# Patient Record
Sex: Male | Born: 1955
Health system: Southern US, Community
[De-identification: ages and names within clinical notes are randomized; demographics above are authoritative.]

## PROBLEM LIST (undated history)

## (undated) DIAGNOSIS — M1990 Unspecified osteoarthritis, unspecified site: Secondary | ICD-10-CM

## (undated) DIAGNOSIS — I00 Rheumatic fever without heart involvement: Secondary | ICD-10-CM

## (undated) DIAGNOSIS — Z8679 Personal history of other diseases of the circulatory system: Secondary | ICD-10-CM

## (undated) DIAGNOSIS — R002 Palpitations: Secondary | ICD-10-CM

## (undated) DIAGNOSIS — N419 Inflammatory disease of prostate, unspecified: Secondary | ICD-10-CM

## (undated) DIAGNOSIS — R0789 Other chest pain: Secondary | ICD-10-CM

## (undated) DIAGNOSIS — I10 Essential (primary) hypertension: Secondary | ICD-10-CM

## (undated) DIAGNOSIS — E785 Hyperlipidemia, unspecified: Secondary | ICD-10-CM

## (undated) DIAGNOSIS — N400 Enlarged prostate without lower urinary tract symptoms: Secondary | ICD-10-CM

## (undated) DIAGNOSIS — F419 Anxiety disorder, unspecified: Secondary | ICD-10-CM

## (undated) DIAGNOSIS — N189 Chronic kidney disease, unspecified: Secondary | ICD-10-CM

## (undated) DIAGNOSIS — R011 Cardiac murmur, unspecified: Secondary | ICD-10-CM

## (undated) HISTORY — DX: Unspecified osteoarthritis, unspecified site: M19.90

## (undated) HISTORY — DX: Personal history of other diseases of the circulatory system: Z86.79

## (undated) HISTORY — DX: Chronic kidney disease, unspecified: N18.9

## (undated) HISTORY — DX: Anxiety disorder, unspecified: F41.9

## (undated) HISTORY — PX: POLYPECTOMY: SHX149

## (undated) HISTORY — DX: Cardiac murmur, unspecified: R01.1

## (undated) HISTORY — DX: Other chest pain: R07.89

## (undated) HISTORY — PX: COLONOSCOPY: SHX174

## (undated) HISTORY — PX: LITHOTRIPSY: SUR834

## (undated) HISTORY — DX: Hyperlipidemia, unspecified: E78.5

## (undated) HISTORY — DX: Essential (primary) hypertension: I10

## (undated) HISTORY — DX: Palpitations: R00.2

## (undated) HISTORY — DX: Inflammatory disease of prostate, unspecified: N41.9

## (undated) HISTORY — DX: Benign prostatic hyperplasia without lower urinary tract symptoms: N40.0

## (undated) HISTORY — DX: Rheumatic fever without heart involvement: I00

---

## 2008-10-07 ENCOUNTER — Ambulatory Visit: Payer: Self-pay | Admitting: Gastroenterology

## 2008-10-14 ENCOUNTER — Ambulatory Visit: Payer: Self-pay | Admitting: Gastroenterology

## 2008-10-14 ENCOUNTER — Encounter: Payer: Self-pay | Admitting: Gastroenterology

## 2008-10-16 ENCOUNTER — Encounter: Payer: Self-pay | Admitting: Gastroenterology

## 2011-10-19 ENCOUNTER — Encounter: Payer: Self-pay | Admitting: Gastroenterology

## 2012-05-31 ENCOUNTER — Encounter: Payer: Self-pay | Admitting: Gastroenterology

## 2012-06-08 ENCOUNTER — Ambulatory Visit: Payer: Self-pay | Admitting: Urology

## 2012-08-14 ENCOUNTER — Ambulatory Visit: Payer: Self-pay | Admitting: Urology

## 2012-08-14 DIAGNOSIS — N23 Unspecified renal colic: Secondary | ICD-10-CM | POA: Insufficient documentation

## 2012-08-17 ENCOUNTER — Ambulatory Visit: Payer: Self-pay | Admitting: Urology

## 2012-09-01 ENCOUNTER — Ambulatory Visit: Payer: Self-pay | Admitting: Urology

## 2012-12-25 ENCOUNTER — Encounter (INDEPENDENT_AMBULATORY_CARE_PROVIDER_SITE_OTHER): Payer: BC Managed Care – PPO

## 2012-12-25 DIAGNOSIS — R079 Chest pain, unspecified: Secondary | ICD-10-CM

## 2012-12-28 ENCOUNTER — Encounter: Payer: Self-pay | Admitting: Internal Medicine

## 2013-02-26 ENCOUNTER — Ambulatory Visit: Payer: Self-pay | Admitting: Urology

## 2013-02-26 DIAGNOSIS — N200 Calculus of kidney: Secondary | ICD-10-CM | POA: Insufficient documentation

## 2013-02-26 DIAGNOSIS — Z87442 Personal history of urinary calculi: Secondary | ICD-10-CM | POA: Insufficient documentation

## 2013-08-07 ENCOUNTER — Ambulatory Visit: Payer: BC Managed Care – PPO | Admitting: Internal Medicine

## 2013-08-31 ENCOUNTER — Ambulatory Visit (INDEPENDENT_AMBULATORY_CARE_PROVIDER_SITE_OTHER): Payer: BC Managed Care – PPO | Admitting: Internal Medicine

## 2013-08-31 VITALS — BP 130/70 | HR 57 | Ht 68.0 in | Wt 205.0 lb

## 2013-08-31 DIAGNOSIS — F411 Generalized anxiety disorder: Secondary | ICD-10-CM

## 2013-08-31 DIAGNOSIS — R002 Palpitations: Secondary | ICD-10-CM

## 2013-08-31 DIAGNOSIS — Z8679 Personal history of other diseases of the circulatory system: Secondary | ICD-10-CM

## 2013-08-31 DIAGNOSIS — R0789 Other chest pain: Secondary | ICD-10-CM

## 2013-08-31 DIAGNOSIS — F419 Anxiety disorder, unspecified: Secondary | ICD-10-CM

## 2013-08-31 DIAGNOSIS — I4892 Unspecified atrial flutter: Secondary | ICD-10-CM

## 2013-08-31 DIAGNOSIS — I10 Essential (primary) hypertension: Secondary | ICD-10-CM

## 2013-09-03 ENCOUNTER — Encounter: Payer: Self-pay | Admitting: Internal Medicine

## 2013-09-03 DIAGNOSIS — R002 Palpitations: Secondary | ICD-10-CM | POA: Insufficient documentation

## 2013-09-03 DIAGNOSIS — F419 Anxiety disorder, unspecified: Secondary | ICD-10-CM | POA: Insufficient documentation

## 2013-09-03 DIAGNOSIS — R0789 Other chest pain: Secondary | ICD-10-CM | POA: Insufficient documentation

## 2013-09-03 DIAGNOSIS — I10 Essential (primary) hypertension: Secondary | ICD-10-CM | POA: Insufficient documentation

## 2013-09-03 DIAGNOSIS — Z8679 Personal history of other diseases of the circulatory system: Secondary | ICD-10-CM | POA: Insufficient documentation

## 2013-09-03 NOTE — Progress Notes (Signed)
OFFICE NOTE  Chief Complaint:  Palpitations  Primary Care Physician: Haywood Pao, MD  HPI:  Richard Webster is a 58 year old gentleman you recently saw in the office for a physical. It had been about 4 years since his last physical, and he had an EKG which was slightly abnormal showing an incomplete right bundle branch block. He then, in conversation, apparently described symptoms of left-sided chest tingling and a sharp chest discomfort which occurred after walking associated with some mild heaviness in his chest. He also is quite active. 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 During these activities he denies any of these symptoms. He has had occasional numbness down his left arm from time to time. He recently had a chiropractor visit. The chiropractor did a neck realignment and he feels that his symptoms got a little bit better. He also tells me that he has a history of rheumatic fever as a child and had a murmur in the past but it has since then gotten better.  His last office visit in 12/2012 , he underwent metabolic testing.  Peak VO2 was 90% and our RER was 1.08.  The heart rate and VO2 curves were essentially normal.  This is very reassuring. Since that time however he has described more frequent fluttering in the chest and is here for evaluation of that today to  PMHx:  Past Medical History  Diagnosis Date  . Palpitations   . Atypical chest pain   . Anxiety   . Heart murmur   . Rheumatic fever     History reviewed. No pertinent past surgical history.  FAMHx:  Family History  Problem Relation Age of Onset  . Stroke Mother   . Dementia Mother     SOCHx:   reports that he quit smoking about 30 years ago. His smoking use included Cigarettes. He smoked 0.00 packs per day. He does not have any smokeless tobacco history on file. His alcohol and drug histories are not on file.  ALLERGIES:  Not on File  ROS: A comprehensive review of systems  was negative except for: Cardiovascular: positive for palpitations  HOME MEDS: Current Outpatient Prescriptions  Medication Sig Dispense Refill  . aspirin 81 MG tablet Take 81 mg by mouth daily.      Marland Kitchen losartan (COZAAR) 50 MG tablet        No current facility-administered medications for this visit.    LABS/IMAGING: No results found for this or any previous visit (from the past 48 hour(s)). No results found.  VITALS: BP 130/70  Pulse 57  Ht 5\' 8"  (1.727 m)  Wt 205 lb (92.987 kg)  BMI 31.18 kg/m2  EXAM: General appearance: alert and no distress Neck: no carotid bruit and no JVD Lungs: clear to auscultation bilaterally Heart: regular rate and rhythm, S1, S2 normal and systolic murmur: early systolic 2/6, crescendo at apex Abdomen: soft, non-tender; bowel sounds normal; no masses,  no organomegaly Extremities: extremities normal, atraumatic, no cyanosis or edema Pulses: 2+ and symmetric Skin: Skin color, texture, turgor normal. No rashes or lesions Neurologic: Grossly normal Psych: Mildly anxious  EKG:  Sinus bradycardia at 57  ASSESSMENT: 1. Palpitations 2. History of rheumatic disease 3. History of atypical chest pain 4. Anxiety 5. HTN - at goal  PLAN: 1.   Richard Webster had complained of palpitations in the past however he reports that they are more significant now, occurring on a daily basis. I would recommend placement of a  monitor to see what the etiology is of his palpitations. Unfortunately, his heart rate is at 57 baseline, which leaves little room for additional medications. If he is having significant atrial or ventricular ectopies or arrhythmias, he may need antiarrhythmic therapy.  Plan to see him back to discuss results of his 48 hour Holter monitor.  Pixie Casino, MD, [facility] Attending Cardiologist [facility]  HILTY,Kenneth C 09/03/2013, 7:54 PM

## 2013-09-07 NOTE — Addendum Note (Signed)
Addended by: Chauncy Lean. on: 09/07/2013 04:37 PM   Modules accepted: Orders

## 2014-03-29 ENCOUNTER — Encounter: Payer: Self-pay | Admitting: Internal Medicine

## 2014-04-20 ENCOUNTER — Ambulatory Visit: Payer: Self-pay

## 2014-05-10 ENCOUNTER — Ambulatory Visit (AMBULATORY_SURGERY_CENTER): Payer: Self-pay | Admitting: *Deleted

## 2014-05-10 VITALS — Ht 68.0 in | Wt 206.6 lb

## 2014-05-10 DIAGNOSIS — Z86010 Personal history of colonic polyps: Secondary | ICD-10-CM

## 2014-05-10 MED ORDER — MOVIPREP 100 G PO SOLR: 1.0000 | Freq: Once | ORAL | Status: DC

## 2014-05-10 NOTE — Progress Notes (Signed)
No egg or soy llergy. ewm No home 02 or c pap use. ewm No diet pills. ewm No blood thinners. ewm No problems with past sedation. ewm No email per pt and wife. ewm

## 2014-05-15 ENCOUNTER — Encounter: Payer: BC Managed Care – PPO | Admitting: Internal Medicine

## 2014-05-20 ENCOUNTER — Encounter: Payer: Self-pay | Admitting: Internal Medicine

## 2014-05-24 ENCOUNTER — Encounter: Payer: Self-pay | Admitting: Internal Medicine

## 2014-05-24 ENCOUNTER — Ambulatory Visit (AMBULATORY_SURGERY_CENTER): Payer: BC Managed Care – PPO | Admitting: Internal Medicine

## 2014-05-24 VITALS — BP 154/99 | HR 55 | Temp 97.3°F | Resp 12 | Ht 68.0 in | Wt 206.0 lb

## 2014-05-24 DIAGNOSIS — D123 Benign neoplasm of transverse colon: Secondary | ICD-10-CM

## 2014-05-24 DIAGNOSIS — Z86010 Personal history of colonic polyps: Secondary | ICD-10-CM

## 2014-05-24 MED ORDER — SODIUM CHLORIDE 0.9 % IV SOLN: 500.0000 mL | INTRAVENOUS | Status: DC

## 2014-05-24 NOTE — Progress Notes (Signed)
Patient awakening,vss,report to rn 

## 2014-05-24 NOTE — Patient Instructions (Signed)
YOU HAD AN ENDOSCOPIC PROCEDURE TODAY AT THE [facility]: Refer to the procedure report that was given to you for any specific questions about what was found during the examination.  If the procedure report does not answer your questions, please call your gastroenterologist to clarify.  If you requested that your care partner not be given the details of your procedure findings, then the procedure report has been included in a sealed envelope for you to review at your convenience later.  YOU SHOULD EXPECT: Some feelings of bloating in the abdomen. Passage of more gas than usual.  Walking can help get rid of the air that was put into your GI tract during the procedure and reduce the bloating. If you had a lower endoscopy (such as a colonoscopy or flexible sigmoidoscopy) you may notice spotting of blood in your stool or on the toilet paper. If you underwent a bowel prep for your procedure, then you may not have a normal bowel movement for a few days.  DIET: Your first meal following the procedure should be a light meal and then it is ok to progress to your normal diet.  A half-sandwich or bowl of soup is an example of a good first meal.  Heavy or fried foods are harder to digest and may make you feel nauseous or bloated.  Likewise meals heavy in dairy and vegetables can cause extra gas to form and this can also increase the bloating.  Drink plenty of fluids but you should avoid alcoholic beverages for 24 hours.  ACTIVITY: Your care partner should take you home directly after the procedure.  You should plan to take it easy, moving slowly for the rest of the day.  You can resume normal activity the day after the procedure however you should NOT DRIVE or use heavy machinery for 24 hours (because of the sedation medicines used during the test).    SYMPTOMS TO REPORT IMMEDIATELY: A gastroenterologist can be reached at any hour.  During normal business hours, 8:30 AM to 5:00 PM Monday through Friday,  call [phone].  After hours and on weekends, please call the GI answering service at [phone] who will take a message and have the physician on call contact you.   Following lower endoscopy (colonoscopy or flexible sigmoidoscopy):  Excessive amounts of blood in the stool  Significant tenderness or worsening of abdominal pains  Swelling of the abdomen that is new, acute  Fever of 100F or higher   FOLLOW UP: If any biopsies were taken you will be contacted by phone or by letter within the next 1-3 weeks.  Call your gastroenterologist if you have not heard about the biopsies in 3 weeks.  Our staff will call the home number listed on your records the next business day following your procedure to check on you and address any questions or concerns that you may have at that time regarding the information given to you following your procedure. This is a courtesy call and so if there is no answer at the home number and we have not heard from you through the emergency physician on call, we will assume that you have returned to your regular daily activities without incident.  SIGNATURES/CONFIDENTIALITY: You and/or your care partner have signed paperwork which will be entered into your electronic medical record.  These signatures attest to the fact that that the information above on your After Visit Summary has been reviewed and is understood.  Full responsibility of the confidentiality of   this discharge information lies with you and/or your care-partner.   INFORMATION ON POLYPS GIVEN TO YOU TODAY   AWAIT PATHOLOGY RESULTS

## 2014-05-24 NOTE — Op Note (Signed)
[facility]  [address] [place], [address]   COLONOSCOPY PROCEDURE REPORT  PATIENT: Richard Webster, Richard Webster  MR#: [id number] BIRTHDATE: 10-Jan-1956 , 42  yrs. old GENDER: male ENDOSCOPIST: Jerene Bears, MD PROCEDURE DATE:  05/24/2014 PROCEDURE:   Colonoscopy with cold biopsy polypectomy First Screening Colonoscopy - Avg.  risk and is 50 yrs.  old or older - No.  Prior Negative Screening - Now for repeat screening. N/A  History of Adenoma - Now for follow-up colonoscopy & has been > or = to 3 yrs.  Yes hx of adenoma.  Has been 3 or more years since last colonoscopy.  Polyps Removed Today? Yes. ASA CLASS:   Class II INDICATIONS:high risk personal history of colonic polyps.   last colonoscopy 2010 [facility]) MEDICATIONS: Monitored anesthesia care and Propofol 300 mg IV  DESCRIPTION OF PROCEDURE:   After the risks benefits and alternatives of the procedure were thoroughly explained, informed consent was obtained.  The digital rectal exam revealed no rectal mass.   The LB [id number]  endoscope was introduced through the anus and advanced to the cecum, which was identified by both the appendix and ileocecal valve. No adverse events experienced. The quality of the prep was good, using MoviPrep  The instrument was then slowly withdrawn as the colon was fully examined.   COLON FINDINGS: Three sessile polyps ranging between 3-48mm in size were found in the transverse colon.  Polypectomies were performed with cold forceps.  The resection was complete, the polyp tissue was completely retrieved and sent to histology.   The examination was otherwise normal.  Retroflexed views revealed internal hemorrhoids. The time to cecum=1 minutes 56 seconds.  Withdrawal time=14 minutes 56 seconds.  The scope was withdrawn and the procedure completed. COMPLICATIONS: There were no immediate complications.  ENDOSCOPIC IMPRESSION: 1.   Three sessile polyps ranging between 3-11mm in size  were found in the transverse colon; polypectomies were performed with cold forceps 2.   The examination was otherwise normal  RECOMMENDATIONS: 1.  Await pathology results 2.  Timing of repeat colonoscopy will be determined by pathology findings. 3.  You will receive a letter within 1-2 weeks with the results of your biopsy as well as final recommendations.  Please call my office if you have not received a letter after 3 weeks.  eSigned:  Jerene Bears, MD 05/24/2014 2:35 PM   cc: Domenick Gong, MD and The Patient

## 2014-05-24 NOTE — Progress Notes (Signed)
Called to room to assist during endoscopic procedure.  Patient ID and intended procedure confirmed with present staff. Received instructions for my participation in the procedure from the performing physician.  

## 2014-05-27 ENCOUNTER — Telehealth: Payer: Self-pay | Admitting: *Deleted

## 2014-05-27 NOTE — Telephone Encounter (Signed)
Number identifier, left message, follow-up  

## 2014-05-30 ENCOUNTER — Encounter: Payer: Self-pay | Admitting: Internal Medicine

## 2015-01-06 ENCOUNTER — Encounter: Payer: Self-pay | Admitting: Gastroenterology

## 2015-01-21 ENCOUNTER — Encounter: Payer: Self-pay | Admitting: *Deleted

## 2016-11-17 DIAGNOSIS — N401 Enlarged prostate with lower urinary tract symptoms: Secondary | ICD-10-CM | POA: Insufficient documentation

## 2016-11-17 DIAGNOSIS — N138 Other obstructive and reflux uropathy: Secondary | ICD-10-CM | POA: Insufficient documentation

## 2017-02-28 ENCOUNTER — Encounter: Payer: Self-pay | Admitting: *Deleted

## 2017-03-14 DIAGNOSIS — R310 Gross hematuria: Secondary | ICD-10-CM | POA: Insufficient documentation

## 2017-04-13 DIAGNOSIS — N411 Chronic prostatitis: Secondary | ICD-10-CM | POA: Insufficient documentation

## 2017-04-13 DIAGNOSIS — R82994 Hypercalciuria: Secondary | ICD-10-CM | POA: Insufficient documentation

## 2017-06-01 ENCOUNTER — Encounter: Payer: Self-pay | Admitting: Internal Medicine

## 2017-06-09 ENCOUNTER — Encounter: Payer: Self-pay | Admitting: Internal Medicine

## 2017-06-24 ENCOUNTER — Other Ambulatory Visit: Payer: Self-pay

## 2017-06-24 ENCOUNTER — Ambulatory Visit (AMBULATORY_SURGERY_CENTER): Payer: Self-pay | Admitting: *Deleted

## 2017-06-24 VITALS — Ht 68.0 in | Wt 208.0 lb

## 2017-06-24 DIAGNOSIS — Z86010 Personal history of colonic polyps: Secondary | ICD-10-CM

## 2017-06-24 MED ORDER — NA SULFATE-K SULFATE-MG SULF 17.5-3.13-1.6 GM/177ML PO SOLN: 1.0000 | Freq: Once | ORAL | 0 refills | Status: AC

## 2017-06-24 NOTE — Progress Notes (Signed)
No egg or soy allergy known to patient  No issues with past sedation with any surgeries  or procedures, never been  intubation  No diet pills per patient No home 02 use per patient  No blood thinners per patient  Pt denies issues with constipation  No A fib or A flutter  EMMI video sent to pt's e mail -pt declined  Wife in [place] today with pt

## 2017-06-30 ENCOUNTER — Encounter: Payer: Self-pay | Admitting: Internal Medicine

## 2017-07-06 ENCOUNTER — Ambulatory Visit (AMBULATORY_SURGERY_CENTER): Payer: BLUE CROSS/BLUE SHIELD | Admitting: Internal Medicine

## 2017-07-06 ENCOUNTER — Other Ambulatory Visit: Payer: Self-pay

## 2017-07-06 ENCOUNTER — Encounter: Payer: Self-pay | Admitting: Internal Medicine

## 2017-07-06 VITALS — BP 128/87 | HR 63 | Temp 96.6°F | Resp 11 | Ht 68.0 in | Wt 208.0 lb

## 2017-07-06 DIAGNOSIS — D122 Benign neoplasm of ascending colon: Secondary | ICD-10-CM

## 2017-07-06 DIAGNOSIS — D123 Benign neoplasm of transverse colon: Secondary | ICD-10-CM | POA: Diagnosis not present

## 2017-07-06 DIAGNOSIS — Z86010 Personal history of colonic polyps: Secondary | ICD-10-CM | POA: Diagnosis present

## 2017-07-06 MED ORDER — SODIUM CHLORIDE 0.9 % IV SOLN: 500.0000 mL | Freq: Once | INTRAVENOUS | Status: DC

## 2017-07-06 NOTE — Progress Notes (Signed)
Pt's states no medical or surgical changes since previsit or office visit. 

## 2017-07-06 NOTE — Patient Instructions (Signed)
   Handouts given; polyps and Hemorrhoids.  YOU HAD AN ENDOSCOPIC PROCEDURE TODAY AT [facility]:   Refer to the procedure report that was given to you for any specific questions about what was found during the examination.  If the procedure report does not answer your questions, please call your gastroenterologist to clarify.  If you requested that your care partner not be given the details of your procedure findings, then the procedure report has been included in a sealed envelope for you to review at your convenience later.  YOU SHOULD EXPECT: Some feelings of bloating in the abdomen. Passage of more gas than usual.  Walking can help get rid of the air that was put into your GI tract during the procedure and reduce the bloating. If you had a lower endoscopy (such as a colonoscopy or flexible sigmoidoscopy) you may notice spotting of blood in your stool or on the toilet paper. If you underwent a bowel prep for your procedure, you may not have a normal bowel movement for a few days.  Please Note:  You might notice some irritation and congestion in your nose or some drainage.  This is from the oxygen used during your procedure.  There is no need for concern and it should clear up in a day or so.  SYMPTOMS TO REPORT IMMEDIATELY:   Following lower endoscopy (colonoscopy or flexible sigmoidoscopy):  Excessive amounts of blood in the stool  Significant tenderness or worsening of abdominal pains  Swelling of the abdomen that is new, acute  Fever of 100F or higher   For urgent or emergent issues, a gastroenterologist can be reached at any hour by calling [phone].   DIET:  We do recommend a small meal at first, but then you may proceed to your regular diet.  Drink plenty of fluids but you should avoid alcoholic beverages for 24 hours.  ACTIVITY:  You should plan to take it easy for the rest of today and you should NOT DRIVE or use heavy machinery until tomorrow (because of  the sedation medicines used during the test).    FOLLOW UP: Our staff will call the number listed on your records the next business day following your procedure to check on you and address any questions or concerns that you may have regarding the information given to you following your procedure. If we do not reach you, we will leave a message.  However, if you are feeling well and you are not experiencing any problems, there is no need to return our call.  We will assume that you have returned to your regular daily activities without incident.  If any biopsies were taken you will be contacted by phone or by letter within the next 1-3 weeks.  Please call us at [phone] if you have not heard about the biopsies in 3 weeks.    SIGNATURES/CONFIDENTIALITY: You and/or your care partner have signed paperwork which will be entered into your electronic medical record.  These signatures attest to the fact that that the information above on your After Visit Summary has been reviewed and is understood.  Full responsibility of the confidentiality of this discharge information lies with you and/or your care-partner.

## 2017-07-06 NOTE — Progress Notes (Signed)
Called to room to assist during endoscopic procedure.  Patient ID and intended procedure confirmed with present staff. Received instructions for my participation in the procedure from the performing physician.  

## 2017-07-06 NOTE — Op Note (Signed)
[facility] Patient Name: Richard Webster Procedure Date: 07/06/2017 3:28 PM MRN: [id number] Endoscopist: Jerene Bears , MD Age: 61 Referring MD:  Date of Birth: Dec 24, 1955 Gender: Male Account #: [id number] Procedure:                Colonoscopy Indications:              Surveillance: Personal history of adenomatous                            polyps on last colonoscopy 3 years ago Medicines:                Monitored Anesthesia Care Procedure:                Pre-Anesthesia Assessment:                           - Prior to the procedure, a History and Physical                            was performed, and patient medications and                            allergies were reviewed. The patient's tolerance of                            previous anesthesia was also reviewed. The risks                            and benefits of the procedure and the sedation                            options and risks were discussed with the patient.                            All questions were answered, and informed consent                            was obtained. Prior Anticoagulants: The patient has                            taken no previous anticoagulant or antiplatelet                            agents. ASA Grade Assessment: II - A patient with                            mild systemic disease. After reviewing the risks                            and benefits, the patient was deemed in                            satisfactory condition to undergo the procedure.  After obtaining informed consent, the colonoscope                            was passed under direct vision. Throughout the                            procedure, the patient's blood pressure, pulse, and                            oxygen saturations were monitored continuously. The                            Model CF-[id number]) scope was introduced                            through the anus and  advanced to the the cecum,                            identified by appendiceal orifice and ileocecal                            valve. The colonoscopy was performed without                            difficulty. The patient tolerated the procedure                            well. The quality of the bowel preparation was                            good. The ileocecal valve, appendiceal orifice, and                            rectum were photographed. Scope In: 3:38:02 PM Scope Out: 3:55:19 PM Scope Withdrawal Time: 0 hours 15 minutes 34 seconds  Total Procedure Duration: 0 hours 17 minutes 17 seconds  Findings:                 The digital rectal exam was normal.                           Two flat polyps were found in the ascending colon.                            The polyps were 4 to 5 mm in size. These polyps                            were removed with a cold snare. Resection and                            retrieval were complete.                           Three sessile polyps were found in the transverse  colon. The polyps were 3 to 5 mm in size. These                            polyps were removed with a cold snare. Resection                            and retrieval were complete.                           Internal hemorrhoids were found during                            retroflexion. The hemorrhoids were small. Complications:            No immediate complications. Estimated Blood Loss:     Estimated blood loss was minimal. Impression:               - Two 4 to 5 mm polyps in the ascending colon,                            removed with a cold snare. Resected and retrieved.                           - Three 3 to 5 mm polyps in the transverse colon,                            removed with a cold snare. Resected and retrieved.                           - Small internal hemorrhoids. Recommendation:           - Patient has a contact number available for                             emergencies. The signs and symptoms of potential                            delayed complications were discussed with the                            patient. Return to normal activities tomorrow.                            Written discharge instructions were provided to the                            patient.                           - Resume previous diet.                           - Continue present medications.                           - Await  pathology results.                           - Repeat colonoscopy is recommended for                            surveillance. The colonoscopy date will be                            determined after pathology results from today's                            exam become available for review. Jerene Bears, MD 07/06/2017 3:59:00 PM This report has been signed electronically.

## 2017-07-06 NOTE — Progress Notes (Signed)
To recovery, report to RN, VSS. 

## 2017-07-07 ENCOUNTER — Telehealth: Payer: Self-pay

## 2017-07-07 NOTE — Telephone Encounter (Signed)
  Follow up Call-  Call back number 07/06/2017  Post procedure Call Back phone  # [phone]  Permission to leave phone message Yes  Some recent data might be hidden     Patient questions:  Do you have a fever, pain , or abdominal swelling? No. Pain Score  0 *  Have you tolerated food without any problems? Yes.    Have you been able to return to your normal activities? Yes.    Do you have any questions about your discharge instructions: Diet   No. Medications  No. Follow up visit  No.  Do you have questions or concerns about your Care? No.  Actions: * If pain score is 4 or above: No action needed, pain <4.

## 2017-07-11 ENCOUNTER — Encounter: Payer: Self-pay | Admitting: Internal Medicine

## 2018-05-17 ENCOUNTER — Ambulatory Visit
Admission: RE | Admit: 2018-05-17 | Discharge: 2018-05-17 | Disposition: A | Discharge status: Home / Self Care | Payer: BLUE CROSS/BLUE SHIELD | Source: Ambulatory Visit | Attending: Urology | Admitting: Urology

## 2018-05-17 ENCOUNTER — Ambulatory Visit: Payer: BLUE CROSS/BLUE SHIELD | Admitting: Urology

## 2018-05-17 ENCOUNTER — Other Ambulatory Visit: Payer: Self-pay

## 2018-05-17 ENCOUNTER — Encounter: Payer: Self-pay | Admitting: Urology

## 2018-05-17 VITALS — BP 146/72 | HR 65 | Ht 68.0 in | Wt 183.8 lb

## 2018-05-17 DIAGNOSIS — N200 Calculus of kidney: Secondary | ICD-10-CM | POA: Diagnosis not present

## 2018-05-17 NOTE — Progress Notes (Signed)
05/17/2018 10:31 AM   Richard Webster 04/30/1956 [id number]  Referring provider: Haywood Pao, MD [address] [place], [place] [address]  Chief Complaint  Patient presents with  . Establish Care  . Nephrolithiasis    HPI: 62 year old male presents to establish local urologic care.  He has a history of recurrent stone disease.  He was last seen by Dr. Jacqlyn Larsen at [facility] in March 2019 after ureteroscopic removal of an 11 mm left renal pelvic calculus.  His stent was removed and late March 2019.  His CT showed no other calculi.  He has had 2 previous SWLs.  He states he had a metabolic evaluation while at [facility] however I do not see the results in epic.  He states no significant abnormalities were identified.  Stone analysis was mixed calcium oxalate.  Since his last visit [facility] he states he has been doing well.  Denies flank/abdominal pain.  Denies dysuria or gross hematuria.  He has recently had onset of nocturia 4-5 times per night with fairly large urine volumes.  He has no daytime lower urinary tract symptoms.   PMH: Past Medical History:  Diagnosis Date  . Anxiety    pt denies  . Arthritis   . Atypical chest pain   . BPH (benign prostatic hyperplasia)   . Chronic kidney disease    kidney stones  . H/O rheumatic heart disease   . Heart murmur   . History of right bundle branch block (RBBB)   . Hypertension   . Palpitations   . Prostatitis   . Rheumatic fever     Surgical History: Past Surgical History:  Procedure Laterality Date  . COLONOSCOPY    . LITHOTRIPSY     x2 for kidney stones  . POLYPECTOMY      Home Medications:  Allergies as of 05/17/2018   No Known Allergies     Medication List        Accurate as of 05/17/18 10:31 AM. Always use your most recent med list.          amLODipine 10 MG tablet Commonly known as:  NORVASC Take 10 mg by mouth daily.   aspirin 81 MG tablet Take 81 mg by mouth daily.   hydrochlorothiazide 12.5 MG  capsule Commonly known as:  MICROZIDE TAKE ONE CAP BY MOUTH EVERY MORNING       Allergies: No Known Allergies  Family History: Family History  Problem Relation Age of Onset  . Stroke Mother   . Dementia Mother   . Colon cancer Neg Hx   . Rectal cancer Neg Hx   . Stomach cancer Neg Hx   . Colon polyps Neg Hx   . Esophageal cancer Neg Hx     Social History:  reports that he quit smoking about 34 years ago. His smoking use included cigarettes. He has quit using smokeless tobacco. He reports that he drinks alcohol. He reports that he does not use drugs.  ROS: UROLOGY Frequent Urination?: No Hard to postpone urination?: No Burning/pain with urination?: No Get up at night to urinate?: Yes Leakage of urine?: No Urine stream starts and stops?: Yes Trouble starting stream?: No Do you have to strain to urinate?: No Blood in urine?: No Urinary tract infection?: No Sexually transmitted disease?: No Injury to kidneys or bladder?: No Painful intercourse?: No Weak stream?: No Erection problems?: No Penile pain?: No  Gastrointestinal Nausea?: No Vomiting?: No Indigestion/heartburn?: No Diarrhea?: No Constipation?: No  Constitutional Fever: No Night sweats?:  No Weight loss?: No Fatigue?: No  Skin Skin rash/lesions?: No Itching?: No  Eyes Blurred vision?: No Double vision?: No  Ears/Nose/Throat Sore throat?: No Sinus problems?: No  Hematologic/Lymphatic Swollen glands?: No Easy bruising?: No  Cardiovascular Leg swelling?: No Chest pain?: No  Respiratory Cough?: No Shortness of breath?: No  Endocrine Excessive thirst?: No  Musculoskeletal Back pain?: No Joint pain?: No  Neurological Headaches?: No Dizziness?: No  Psychologic Depression?: No Anxiety?: No  Physical Exam: BP (!) 146/72 (BP Location: Left Arm, Patient Position: Sitting, Cuff Size: Normal)   Pulse 65   Ht 5\' 8"  (1.727 m)   Wt 183 lb 12.8 oz (83.4 kg)   BMI 27.95 kg/m    Constitutional:  Alert and oriented, No acute distress. HEENT: [place] AT, moist mucus membranes.  Trachea midline, no masses. Cardiovascular: No clubbing, cyanosis, or edema. Respiratory: Normal respiratory effort, no increased work of breathing. GI: Abdomen is soft, nontender, nondistended, no abdominal masses GU: No CVA tenderness Lymph: No cervical or inguinal lymphadenopathy. Skin: No rashes, bruises or suspicious lesions. Neurologic: Grossly intact, no focal deficits, moving all 4 extremities. Psychiatric: Normal mood and affect.   Assessment & Plan:   62 year old male with recurrent stone disease status post ureteroscopic removal of an 11 mm calculus at [facility] in March 2019.  He is presently asymptomatic.  A urinalysis was ordered along with a KUB and he will be notified with results.  His nocturia suspicious for nocturnal polyuria.  He states his voiding symptoms are presently not bothersome.  He had a friend with kidney stones who is taking Theralith XR which is an OTC supplement containing vitamin B6, magnesium citrate, magnesium oxide and potassium citrate.  Return in about 6 months (around 11/16/2018) for Recheck, KUB.   Abbie Sons, [place] [address], [place], [place] [address] [phone]

## 2018-05-18 ENCOUNTER — Telehealth: Payer: Self-pay

## 2018-05-18 LAB — URINALYSIS, COMPLETE
Bilirubin, UA: NEGATIVE
Glucose, UA: NEGATIVE
KETONES UA: NEGATIVE
Leukocytes, UA: NEGATIVE
Nitrite, UA: NEGATIVE
PH UA: 6.5 (ref 5.0–7.5)
Protein, UA: NEGATIVE
Specific Gravity, UA: 1.015 (ref 1.005–1.030)
Urobilinogen, Ur: 1 mg/dL (ref 0.2–1.0)

## 2018-05-18 LAB — MICROSCOPIC EXAMINATION
Bacteria, UA: NONE SEEN
EPITHELIAL CELLS (NON RENAL): NONE SEEN /HPF (ref 0–10)

## 2018-05-18 NOTE — Telephone Encounter (Signed)
-----   Message from Abbie Sons, MD sent at 05/18/2018 10:15 AM EDT ----- KUB shows no evidence of recurrent stones.  Theralith XR supplement he asked about contains potassium citrate and magnesium citrate which may help stone prevention and would be fine to take.  His urinalysis was normal.

## 2018-05-18 NOTE — Telephone Encounter (Signed)
Patient notified

## 2018-11-01 ENCOUNTER — Telehealth: Payer: Self-pay | Admitting: Urology

## 2018-11-01 DIAGNOSIS — Z87442 Personal history of urinary calculi: Secondary | ICD-10-CM

## 2018-11-01 NOTE — Telephone Encounter (Signed)
Order was entered 

## 2018-11-01 NOTE — Telephone Encounter (Signed)
Patient was reschd due to covid19 and he will need a KUB prior. I will need an order for this please.  Thanks, Sharyn Lull

## 2018-11-14 ENCOUNTER — Ambulatory Visit: Payer: BLUE CROSS/BLUE SHIELD | Admitting: Urology

## 2018-12-21 ENCOUNTER — Ambulatory Visit: Payer: BLUE CROSS/BLUE SHIELD | Admitting: Urology

## 2019-02-16 ENCOUNTER — Ambulatory Visit: Payer: BLUE CROSS/BLUE SHIELD | Admitting: Urology

## 2019-10-11 ENCOUNTER — Other Ambulatory Visit
Admission: RE | Admit: 2019-10-11 | Discharge: 2019-10-11 | Disposition: A | Discharge status: Home / Self Care | Payer: Medicare Other | Source: Ambulatory Visit | Attending: Internal Medicine | Admitting: Internal Medicine

## 2019-10-11 DIAGNOSIS — Z79899 Other long term (current) drug therapy: Secondary | ICD-10-CM | POA: Diagnosis not present

## 2019-10-11 DIAGNOSIS — R079 Chest pain, unspecified: Secondary | ICD-10-CM | POA: Diagnosis present

## 2019-10-11 LAB — BRAIN NATRIURETIC PEPTIDE: B Natriuretic Peptide: 32 pg/mL (ref 0.0–100.0)

## 2019-10-30 ENCOUNTER — Other Ambulatory Visit: Admission: RE | Admit: 2019-10-30 | Payer: Medicare Other | Source: Ambulatory Visit

## 2019-11-01 ENCOUNTER — Ambulatory Visit
Admission: RE | Admit: 2019-11-01 | Discharge: 2019-11-01 | Disposition: A | Discharge status: Home / Self Care | Payer: Medicare Other | Attending: Internal Medicine | Admitting: Internal Medicine

## 2019-11-01 DIAGNOSIS — I200 Unstable angina: Secondary | ICD-10-CM

## 2019-11-01 NOTE — Progress Notes (Signed)
Patient arrived for his cardiac catheterization and no COVID test results noted.  Notified Dr. Clayborn Bigness of the above information.  Patient informed that [facility] cardiology will be in touch with him to reschedule procedure and also to notify of COVID test appointment.  Called and spoke with Gwinda Passe at [facility] cardiology of the above and she sent message to dr. Etta Quill nurse to contact this patient.

## 2019-11-08 ENCOUNTER — Other Ambulatory Visit: Admission: RE | Admit: 2019-11-08 | Payer: Medicare Other | Source: Ambulatory Visit

## 2019-11-09 ENCOUNTER — Other Ambulatory Visit
Admission: RE | Admit: 2019-11-09 | Discharge: 2019-11-09 | Disposition: A | Discharge status: Home / Self Care | Payer: Medicare Other | Source: Ambulatory Visit | Attending: Internal Medicine | Admitting: Internal Medicine

## 2019-11-09 DIAGNOSIS — Z20822 Contact with and (suspected) exposure to covid-19: Secondary | ICD-10-CM | POA: Diagnosis not present

## 2019-11-09 DIAGNOSIS — Z01812 Encounter for preprocedural laboratory examination: Secondary | ICD-10-CM | POA: Diagnosis present

## 2019-11-09 LAB — SARS CORONAVIRUS 2 (TAT 6-24 HRS): SARS Coronavirus 2: NEGATIVE

## 2019-11-12 ENCOUNTER — Other Ambulatory Visit: Payer: Self-pay

## 2019-11-12 ENCOUNTER — Ambulatory Visit
Admission: RE | Admit: 2019-11-12 | Discharge: 2019-11-12 | Disposition: A | Discharge status: Home / Self Care | Payer: Medicare Other | Attending: Internal Medicine | Admitting: Internal Medicine

## 2019-11-12 ENCOUNTER — Encounter
Admission: RE | Disposition: A | Discharge status: Home / Self Care | Payer: Self-pay | Source: Home / Self Care | Attending: Internal Medicine

## 2019-11-12 ENCOUNTER — Encounter: Payer: Self-pay | Admitting: Internal Medicine

## 2019-11-12 DIAGNOSIS — I200 Unstable angina: Secondary | ICD-10-CM

## 2019-11-12 DIAGNOSIS — Z6831 Body mass index (BMI) 31.0-31.9, adult: Secondary | ICD-10-CM | POA: Insufficient documentation

## 2019-11-12 DIAGNOSIS — Z79899 Other long term (current) drug therapy: Secondary | ICD-10-CM | POA: Insufficient documentation

## 2019-11-12 DIAGNOSIS — E669 Obesity, unspecified: Secondary | ICD-10-CM | POA: Insufficient documentation

## 2019-11-12 DIAGNOSIS — I700 Atherosclerosis of aorta: Secondary | ICD-10-CM | POA: Diagnosis not present

## 2019-11-12 DIAGNOSIS — I25119 Atherosclerotic heart disease of native coronary artery with unspecified angina pectoris: Secondary | ICD-10-CM | POA: Insufficient documentation

## 2019-11-12 DIAGNOSIS — Z7982 Long term (current) use of aspirin: Secondary | ICD-10-CM | POA: Insufficient documentation

## 2019-11-12 DIAGNOSIS — E785 Hyperlipidemia, unspecified: Secondary | ICD-10-CM | POA: Diagnosis not present

## 2019-11-12 DIAGNOSIS — I10 Essential (primary) hypertension: Secondary | ICD-10-CM | POA: Insufficient documentation

## 2019-11-12 DIAGNOSIS — I4510 Unspecified right bundle-branch block: Secondary | ICD-10-CM | POA: Insufficient documentation

## 2019-11-12 HISTORY — PX: RIGHT/LEFT HEART CATH AND CORONARY ANGIOGRAPHY: CATH118266

## 2019-11-12 LAB — CBC
HCT: 45.5 % (ref 39.0–52.0)
Hemoglobin: 15.6 g/dL (ref 13.0–17.0)
MCH: 30.6 pg (ref 26.0–34.0)
MCHC: 34.3 g/dL (ref 30.0–36.0)
MCV: 89.2 fL (ref 80.0–100.0)
Platelets: 215 10*3/uL (ref 150–400)
RBC: 5.1 MIL/uL (ref 4.22–5.81)
RDW: 13.2 % (ref 11.5–15.5)
WBC: 7 10*3/uL (ref 4.0–10.5)
nRBC: 0 % (ref 0.0–0.2)

## 2019-11-12 LAB — BASIC METABOLIC PANEL
Anion gap: 8 (ref 5–15)
BUN: 21 mg/dL (ref 8–23)
CO2: 29 mmol/L (ref 22–32)
Calcium: 9.1 mg/dL (ref 8.9–10.3)
Chloride: 104 mmol/L (ref 98–111)
Creatinine, Ser: 0.86 mg/dL (ref 0.61–1.24)
GFR calc Af Amer: >60 mL/min (ref >60)
GFR calc non Af Amer: >60 mL/min (ref >60)
Glucose, Bld: 103 mg/dL — ABNORMAL HIGH (ref 70–99)
Potassium: 3.6 mmol/L (ref 3.5–5.1)
Sodium: 141 mmol/L (ref 135–145)

## 2019-11-12 SURGERY — RIGHT/LEFT HEART CATH AND CORONARY ANGIOGRAPHY
Anesthesia: Moderate Sedation

## 2019-11-12 MED ORDER — FENTANYL CITRATE (PF) 100 MCG/2ML IJ SOLN
INTRAMUSCULAR | Status: DC | PRN
  Administered 2019-11-12: 50 ug via INTRAVENOUS

## 2019-11-12 MED ORDER — MIDAZOLAM HCL 2 MG/2ML IJ SOLN
INTRAMUSCULAR | Status: DC | PRN
  Administered 2019-11-12: 1 mg via INTRAVENOUS

## 2019-11-12 MED ORDER — IOHEXOL 300 MG/ML  SOLN
INTRAMUSCULAR | Status: DC | PRN
  Administered 2019-11-12: 14:00:00 90 mL

## 2019-11-12 MED ORDER — ATORVASTATIN CALCIUM 20 MG PO TABS
80.0000 mg | ORAL_TABLET | Freq: Every day | ORAL | Status: DC
  Administered 2019-11-12: 80 mg via ORAL
  Filled 2019-11-12: qty 4

## 2019-11-12 MED ORDER — SODIUM CHLORIDE 0.9 % WEIGHT BASED INFUSION
3.0000 mL/kg/h | INTRAVENOUS | Status: AC
  Administered 2019-11-12: 3 mL/kg/h via INTRAVENOUS

## 2019-11-12 MED ORDER — HYDRALAZINE HCL 20 MG/ML IJ SOLN
INTRAMUSCULAR | Status: AC
  Filled 2019-11-12: qty 1

## 2019-11-12 MED ORDER — HYDRALAZINE HCL 20 MG/ML IJ SOLN
10.0000 mg | INTRAMUSCULAR | Status: DC | PRN
  Administered 2019-11-12: 15:00:00 10 mg via INTRAVENOUS

## 2019-11-12 MED ORDER — ASPIRIN 81 MG PO CHEW
81.0000 mg | CHEWABLE_TABLET | ORAL | Status: AC
  Administered 2019-11-12: 12:00:00 81 mg via ORAL

## 2019-11-12 MED ORDER — VERAPAMIL HCL 2.5 MG/ML IV SOLN
INTRAVENOUS | Status: DC | PRN
  Administered 2019-11-12: 2.5 mg via INTRA_ARTERIAL

## 2019-11-12 MED ORDER — METOPROLOL SUCCINATE ER 50 MG PO TB24
25.0000 mg | ORAL_TABLET | Freq: Every day | ORAL | Status: DC
  Administered 2019-11-12: 16:00:00 25 mg via ORAL

## 2019-11-12 MED ORDER — ISOSORBIDE MONONITRATE ER 30 MG PO TB24
30.0000 mg | ORAL_TABLET | Freq: Every day | ORAL | Status: DC
  Administered 2019-11-12: 30 mg via ORAL
  Filled 2019-11-12: qty 1

## 2019-11-12 MED ORDER — SODIUM CHLORIDE 0.9 % WEIGHT BASED INFUSION: 1.0000 mL/kg/h | INTRAVENOUS | Status: DC

## 2019-11-12 MED ORDER — HEPARIN (PORCINE) IN NACL 1000-0.9 UT/500ML-% IV SOLN
INTRAVENOUS | Status: DC | PRN
  Administered 2019-11-12: 500 mL

## 2019-11-12 MED ORDER — MIDAZOLAM HCL 2 MG/2ML IJ SOLN
INTRAMUSCULAR | Status: AC
  Filled 2019-11-12: qty 2

## 2019-11-12 MED ORDER — ASPIRIN 81 MG PO CHEW
CHEWABLE_TABLET | ORAL | Status: AC
  Filled 2019-11-12: qty 1

## 2019-11-12 MED ORDER — SODIUM CHLORIDE 0.9 % IV SOLN: 250.0000 mL | INTRAVENOUS | Status: DC | PRN

## 2019-11-12 MED ORDER — LABETALOL HCL 5 MG/ML IV SOLN: 10.0000 mg | INTRAVENOUS | Status: DC | PRN

## 2019-11-12 MED ORDER — ACETAMINOPHEN 325 MG PO TABS: 650.0000 mg | ORAL_TABLET | ORAL | Status: DC | PRN

## 2019-11-12 MED ORDER — SODIUM CHLORIDE 0.9% FLUSH: 3.0000 mL | INTRAVENOUS | Status: DC | PRN

## 2019-11-12 MED ORDER — HEPARIN SODIUM (PORCINE) 1000 UNIT/ML IJ SOLN
INTRAMUSCULAR | Status: AC
  Filled 2019-11-12: qty 1

## 2019-11-12 MED ORDER — ONDANSETRON HCL 4 MG/2ML IJ SOLN: 4.0000 mg | Freq: Four times a day (QID) | INTRAMUSCULAR | Status: DC | PRN

## 2019-11-12 MED ORDER — VERAPAMIL HCL 2.5 MG/ML IV SOLN
INTRAVENOUS | Status: AC
  Filled 2019-11-12: qty 2

## 2019-11-12 MED ORDER — SODIUM CHLORIDE 0.9% FLUSH: 3.0000 mL | Freq: Two times a day (BID) | INTRAVENOUS | Status: DC

## 2019-11-12 MED ORDER — ASPIRIN 81 MG PO CHEW: 81.0000 mg | CHEWABLE_TABLET | Freq: Every day | ORAL | Status: DC

## 2019-11-12 MED ORDER — HEPARIN (PORCINE) IN NACL 1000-0.9 UT/500ML-% IV SOLN
INTRAVENOUS | Status: AC
  Filled 2019-11-12: qty 1000

## 2019-11-12 MED ORDER — FENTANYL CITRATE (PF) 100 MCG/2ML IJ SOLN
INTRAMUSCULAR | Status: AC
  Filled 2019-11-12: qty 2

## 2019-11-12 SURGICAL SUPPLY — 12 items
CATH BALLN WEDGE 5F 110CM (CATHETERS) ×3 IMPLANT
CATH INFINITI 5 FR JL3.5 (CATHETERS) ×3 IMPLANT
CATH INFINITI 5FR JL4 (CATHETERS) ×3 IMPLANT
CATH INFINITI JR4 5F (CATHETERS) ×3 IMPLANT
DEVICE RAD TR BAND REGULAR (VASCULAR PRODUCTS) ×3 IMPLANT
GLIDESHEATH SLEND SS 6F .021 (SHEATH) ×3 IMPLANT
GUIDEWIRE EMER 3M J .025X150CM (WIRE) ×3 IMPLANT
GUIDEWIRE INQWIRE 1.5J.035X260 (WIRE) ×2 IMPLANT
INQWIRE 1.5J .035X260CM (WIRE) ×3
KIT MANI 3VAL PERCEP (MISCELLANEOUS) ×3 IMPLANT
PACK CARDIAC CATH (CUSTOM PROCEDURE TRAY) ×3 IMPLANT
SHEATH GLIDE SLENDER 4/5FR (SHEATH) ×3 IMPLANT

## 2019-11-12 NOTE — Discharge Instructions (Signed)
Moderate Conscious Sedation, Adult, Care After These instructions provide you with information about caring for yourself after your procedure. Your health care provider may also give you more specific instructions. Your treatment has been planned according to current medical practices, but problems sometimes occur. Call your health care provider if you have any problems or questions after your procedure. What can I expect after the procedure? After your procedure, it is common:  To feel sleepy for several hours.  To feel clumsy and have poor balance for several hours.  To have poor judgment for several hours.  To vomit if you eat too soon. Follow these instructions at home: For at least 24 hours after the procedure:   Do not: ? Participate in activities where you could fall or become injured. ? Drive. ? Use heavy machinery. ? Drink alcohol. ? Take sleeping pills or medicines that cause drowsiness. ? Make important decisions or sign legal documents. ? Take care of children on your own.  Rest. Eating and drinking  Follow the diet recommended by your health care provider.  If you vomit: ? Drink water, juice, or soup when you can drink without vomiting. ? Make sure you have little or no nausea before eating solid foods. General instructions  Have a responsible adult stay with you until you are awake and alert.  Take over-the-counter and prescription medicines only as told by your health care provider.  If you smoke, do not smoke without supervision.  Keep all follow-up visits as told by your health care provider. This is important. Contact a health care provider if:  You keep feeling nauseous or you keep vomiting.  You feel light-headed.  You develop a rash.  You have a fever. Get help right away if:  You have trouble breathing. This information is not intended to replace advice given to you by your health care provider. Make sure you discuss any questions you have  with your health care provider. Document Revised: 06/24/2017 Document Reviewed: 11/01/2015 Elsevier Patient Education  2020 [employer] Radial Site Care  This sheet gives you information about how to care for yourself after your procedure. Your health care provider may also give you more specific instructions. If you have problems or questions, contact your health care provider. What can I expect after the procedure? After the procedure, it is common to have:  Bruising and tenderness at the catheter insertion area. Follow these instructions at home: Medicines  Take over-the-counter and prescription medicines only as told by your health care provider. Insertion site care  Follow instructions from your health care provider about how to take care of your insertion site. Make sure you: ? Wash your hands with soap and water before you change your bandage (dressing). If soap and water are not available, use hand sanitizer. ? Change your dressing as told by your health care provider. ? Leave stitches (sutures), skin glue, or adhesive strips in place. These skin closures may need to stay in place for 2 weeks or longer. If adhesive strip edges start to loosen and curl up, you may trim the loose edges. Do not remove adhesive strips completely unless your health care provider tells you to do that.  Check your insertion site every day for signs of infection. Check for: ? Redness, swelling, or pain. ? Fluid or blood. ? Pus or a bad smell. ? Warmth.  Do not take baths, swim, or use a hot tub until your health care provider approves.  You may shower 24-48 hours   after the procedure, or as directed by your health care provider. ? Remove the dressing and gently wash the site with plain soap and water. ? Pat the area dry with a clean towel. ? Do not rub the site. That could cause bleeding.  Do not apply powder or lotion to the site. Activity   For 24 hours after the procedure, or as directed by  your health care provider: ? Do not flex or bend the affected arm. ? Do not push or pull heavy objects with the affected arm. ? Do not drive yourself home from the hospital or clinic. You may drive 24 hours after the procedure unless your health care provider tells you not to. ? Do not operate machinery or power tools.  Do not lift anything that is heavier than 10 lb (4.5 kg), or the limit that you are told, until your health care provider says that it is safe.  Ask your health care provider when it is okay to: ? Return to work or school. ? Resume usual physical activities or sports. ? Resume sexual activity. General instructions  If the catheter site starts to bleed, raise your arm and put firm pressure on the site. If the bleeding does not stop, get help right away. This is a medical emergency.  If you went home on the same day as your procedure, a responsible adult should be with you for the first 24 hours after you arrive home.  Keep all follow-up visits as told by your health care provider. This is important. Contact a health care provider if:  You have a fever.  You have redness, swelling, or yellow drainage around your insertion site. Get help right away if:  You have unusual pain at the radial site.  The catheter insertion area swells very fast.  The insertion area is bleeding, and the bleeding does not stop when you hold steady pressure on the area.  Your arm or hand becomes pale, cool, tingly, or numb. These symptoms may represent a serious problem that is an emergency. Do not wait to see if the symptoms will go away. Get medical help right away. Call your local emergency services (911 in the [place] Do not drive yourself to the hospital. Summary  After the procedure, it is common to have bruising and tenderness at the site.  Follow instructions from your health care provider about how to take care of your radial site wound. Check the wound every day for signs of  infection.  Do not lift anything that is heavier than 10 lb (4.5 kg), or the limit that you are told, until your health care provider says that it is safe. This information is not intended to replace advice given to you by your health care provider. Make sure you discuss any questions you have with your health care provider. Document Revised: 08/17/2017 Document Reviewed: 08/17/2017 Elsevier Patient Education  2020 Elsevier Inc.  

## 2019-11-13 ENCOUNTER — Encounter: Payer: Self-pay | Admitting: Cardiology

## 2020-03-27 IMAGING — CR DG ABDOMEN 1V
2 series · 2 of 2 positions shown · non-contrast
Comparison: Radiographs February 26, 2013.

CLINICAL DATA: Nephrolithiasis.

EXAM:
ABDOMEN - 1 VIEW

[abdomen kub (1 of 2)]
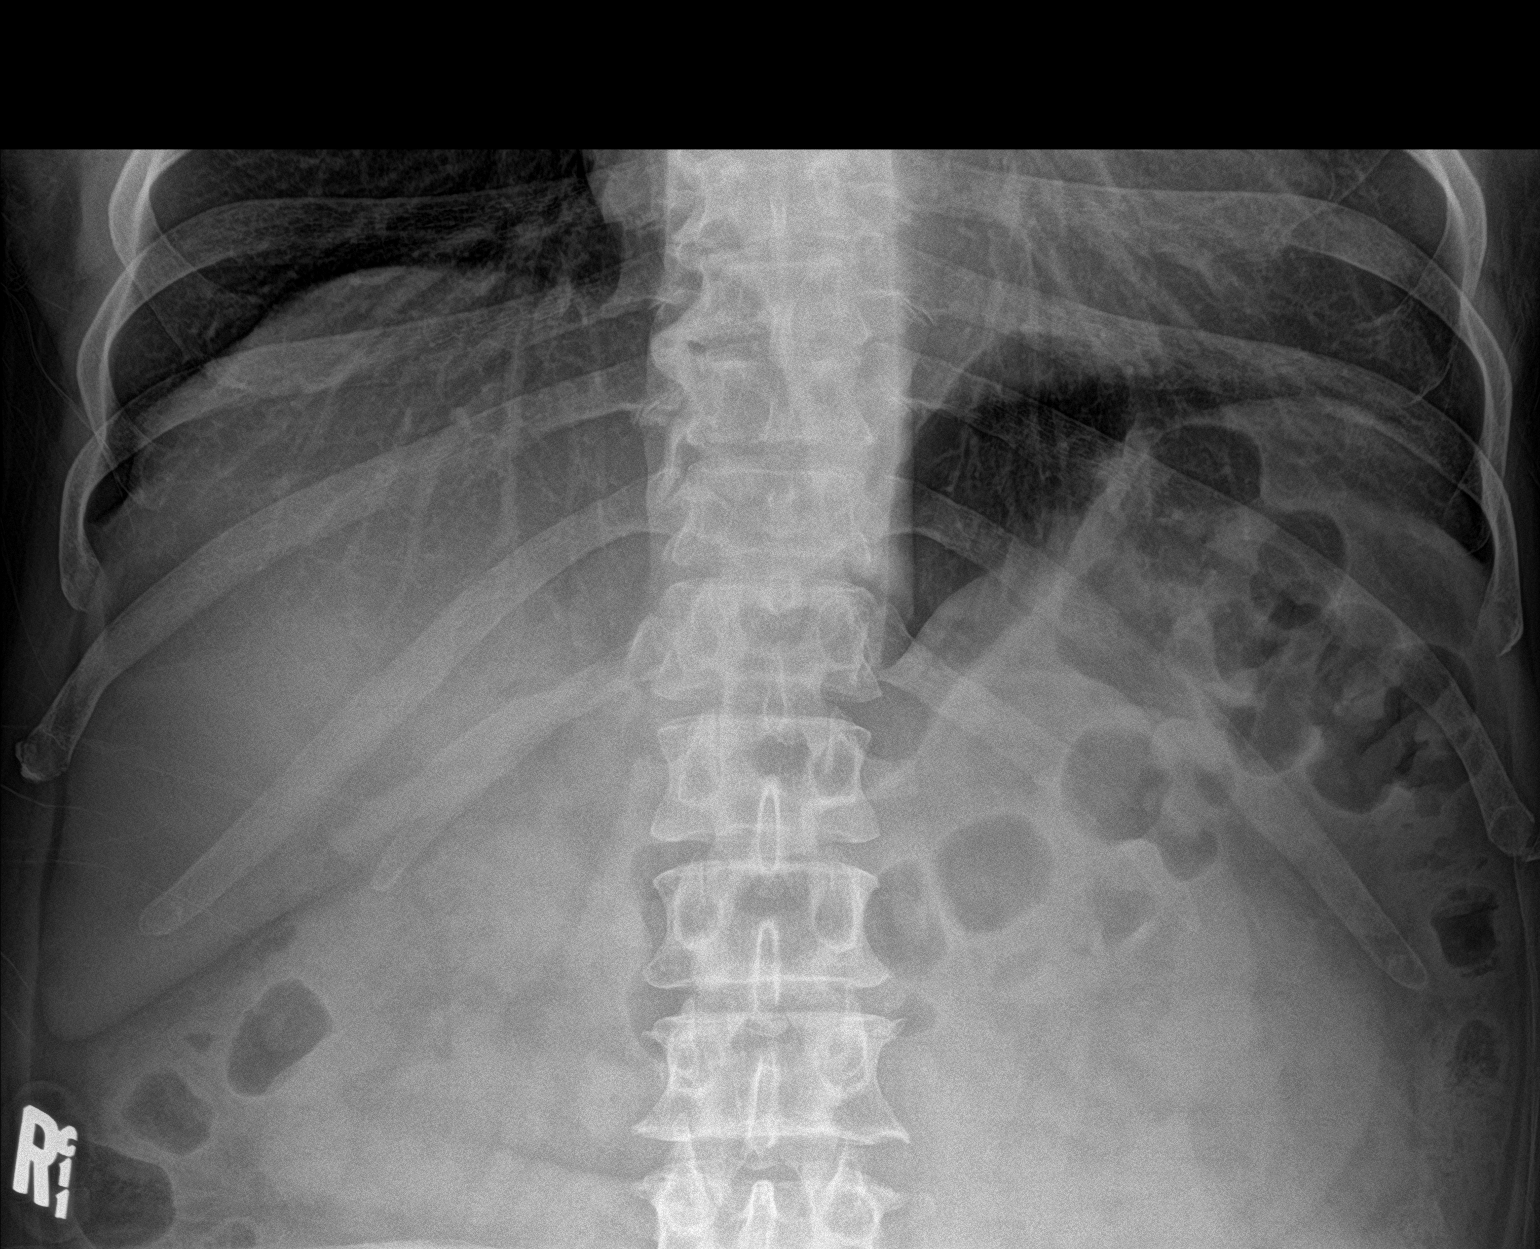

[abdomen kub (2 of 2)]
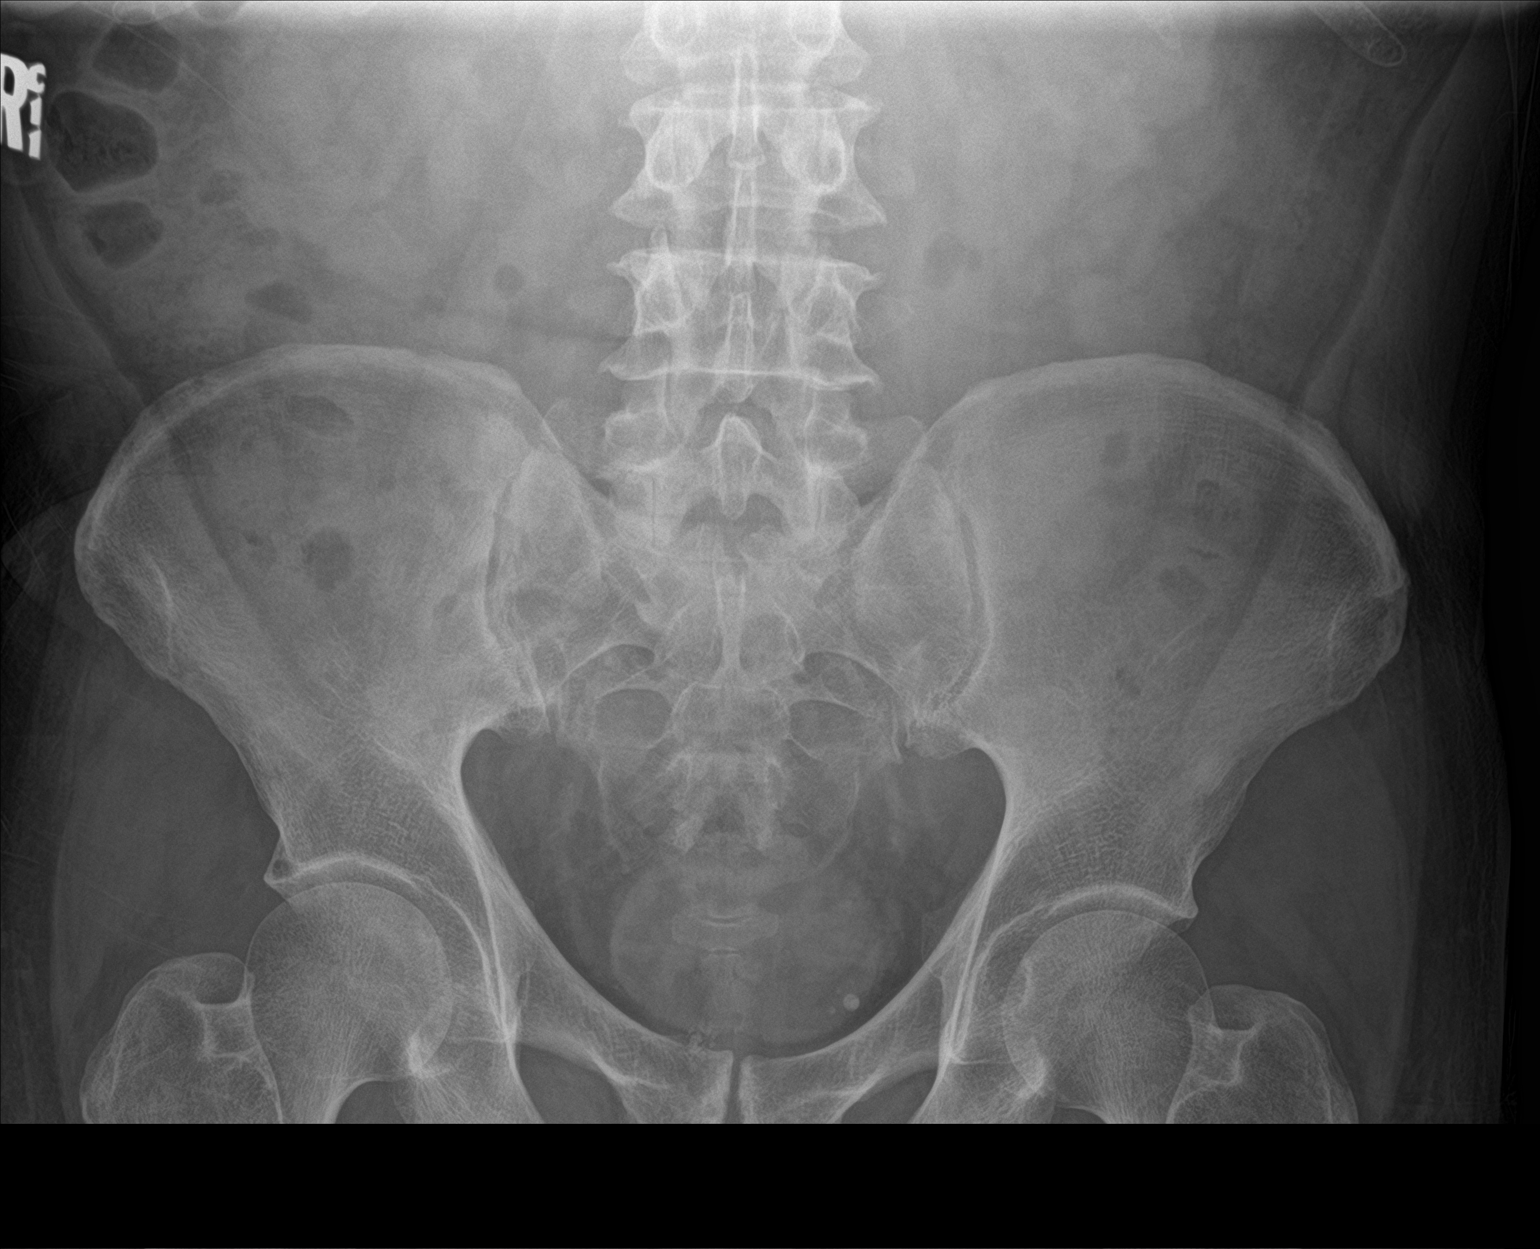

[2 of 2 positions shown; findings below may reference images not displayed]

FINDINGS: The bowel gas pattern is normal. No definite nephrolithiasis is
noted. Phleboliths are seen in the pelvis.
IMPRESSION: No definite evidence of nephrolithiasis.

## 2020-04-08 ENCOUNTER — Encounter: Payer: Medicare Other | Attending: Internal Medicine

## 2020-04-08 ENCOUNTER — Other Ambulatory Visit: Payer: Self-pay

## 2020-04-08 DIAGNOSIS — Z87891 Personal history of nicotine dependence: Secondary | ICD-10-CM | POA: Insufficient documentation

## 2020-04-08 DIAGNOSIS — I129 Hypertensive chronic kidney disease with stage 1 through stage 4 chronic kidney disease, or unspecified chronic kidney disease: Secondary | ICD-10-CM | POA: Insufficient documentation

## 2020-04-08 DIAGNOSIS — R011 Cardiac murmur, unspecified: Secondary | ICD-10-CM | POA: Insufficient documentation

## 2020-04-08 DIAGNOSIS — Z7902 Long term (current) use of antithrombotics/antiplatelets: Secondary | ICD-10-CM | POA: Insufficient documentation

## 2020-04-08 DIAGNOSIS — Z7982 Long term (current) use of aspirin: Secondary | ICD-10-CM | POA: Insufficient documentation

## 2020-04-08 DIAGNOSIS — N189 Chronic kidney disease, unspecified: Secondary | ICD-10-CM | POA: Insufficient documentation

## 2020-04-08 DIAGNOSIS — Z955 Presence of coronary angioplasty implant and graft: Secondary | ICD-10-CM | POA: Insufficient documentation

## 2020-04-08 DIAGNOSIS — Z79899 Other long term (current) drug therapy: Secondary | ICD-10-CM | POA: Insufficient documentation

## 2020-04-08 NOTE — Progress Notes (Signed)
Virtual Visit completed. Patient informed on EP and RD appointment and 6 Minute walk test. Patient also informed of patient health questionnaires on My Chart. Patient Verbalizes understanding. Visit diagnosis can be found in [facility] 12/27/2019.

## 2020-04-10 ENCOUNTER — Other Ambulatory Visit: Payer: Self-pay

## 2020-04-10 VITALS — Ht 68.2 in | Wt 199.0 lb

## 2020-04-10 DIAGNOSIS — Z7982 Long term (current) use of aspirin: Secondary | ICD-10-CM | POA: Diagnosis not present

## 2020-04-10 DIAGNOSIS — Z79899 Other long term (current) drug therapy: Secondary | ICD-10-CM | POA: Diagnosis not present

## 2020-04-10 DIAGNOSIS — R011 Cardiac murmur, unspecified: Secondary | ICD-10-CM | POA: Diagnosis not present

## 2020-04-10 DIAGNOSIS — Z87891 Personal history of nicotine dependence: Secondary | ICD-10-CM | POA: Diagnosis not present

## 2020-04-10 DIAGNOSIS — I129 Hypertensive chronic kidney disease with stage 1 through stage 4 chronic kidney disease, or unspecified chronic kidney disease: Secondary | ICD-10-CM | POA: Diagnosis not present

## 2020-04-10 DIAGNOSIS — N189 Chronic kidney disease, unspecified: Secondary | ICD-10-CM | POA: Diagnosis not present

## 2020-04-10 DIAGNOSIS — Z7902 Long term (current) use of antithrombotics/antiplatelets: Secondary | ICD-10-CM | POA: Diagnosis not present

## 2020-04-10 DIAGNOSIS — Z955 Presence of coronary angioplasty implant and graft: Secondary | ICD-10-CM

## 2020-04-10 NOTE — Progress Notes (Signed)
Cardiac Individual Treatment Plan  Patient Details  Name: Richard Webster MRN: [id number] Date of Birth: October 16, 1955 Referring Provider:     Cardiac Rehab from 04/10/2020 in [facility] Cardiac and Pulmonary Rehab  Referring Provider Dr. Lujean Amel      Initial Encounter Date:    Cardiac Rehab from 04/10/2020 in [facility] Cardiac and Pulmonary Rehab  Date 04/10/20      Visit Diagnosis: Status post coronary artery stent placement  Patient's Home Medications on Admission:  Current Outpatient Medications:  .  aspirin EC 81 MG tablet, Take 81 mg by mouth daily., Disp: , Rfl:  .  clopidogrel (PLAVIX) 75 MG tablet, Take by mouth., Disp: , Rfl:  .  etodolac (LODINE) 400 MG tablet, Take 400 mg by mouth daily., Disp: , Rfl:  .  naproxen sodium (ALEVE) 220 MG tablet, Take 220 mg by mouth daily as needed (pain). (Patient not taking: Reported on 04/08/2020), Disp: , Rfl:  .  nitroGLYCERIN (NITROSTAT) 0.4 MG SL tablet, Place under the tongue., Disp: , Rfl:  .  olmesartan (BENICAR) 20 MG tablet, Take by mouth., Disp: , Rfl:  .  olmesartan-hydrochlorothiazide (BENICAR HCT) 20-12.5 MG tablet, Take 1 tablet by mouth daily., Disp: , Rfl:  .  omeprazole (PRILOSEC) 40 MG capsule, Take 40 mg by mouth daily before breakfast., Disp: , Rfl:  .  pantoprazole (PROTONIX) 20 MG tablet, Take 1 tablet by mouth daily., Disp: , Rfl:  .  rosuvastatin (CRESTOR) 20 MG tablet, Take by mouth., Disp: , Rfl:   Past Medical History: Past Medical History:  Diagnosis Date  . Anxiety    pt denies  . Arthritis   . Atypical chest pain   . BPH (benign prostatic hyperplasia)   . Chronic kidney disease    kidney stones  . H/O rheumatic heart disease   . Heart murmur   . History of right bundle branch block (RBBB)   . Hypertension   . Palpitations   . Prostatitis   . Rheumatic fever     Tobacco Use: Social History   Tobacco Use  Smoking Status Former Smoker  . Packs/day: 0.50  . Years: 15.00  . Pack years: 7.50  .  Types: Cigarettes  . Quit date: 09/04/1983  . Years since quitting: 36.6  Smokeless Tobacco Former Geophysical data processor: Recent Chemical engineer   There is no flowsheet data to display.      Exercise Target Goals: Exercise Program Goal: Individual exercise prescription set using results from initial 6 min walk test and THRR while considering  patient's activity barriers and safety.   Exercise Prescription Goal: Initial exercise prescription builds to 30-45 minutes a day of aerobic activity, 2-3 days per week.  Home exercise guidelines will be given to patient during program as part of exercise prescription that the participant will acknowledge.   Education: Aerobic Exercise & Resistance Training: - Gives group verbal and written instruction on the various components of exercise. Focuses on aerobic and resistive training programs and the benefits of this training and how to safely progress through these programs..   Education: Exercise & Equipment Safety: - Individual verbal instruction and demonstration of equipment use and safety with use of the equipment.   Cardiac Rehab from 04/08/2020 in [facility] Cardiac and Pulmonary Rehab  Date 04/08/20  Educator [facility]  Instruction Review Code 1- Verbalizes Understanding      Education: Exercise Physiology & General Exercise Guidelines: - Group verbal and written instruction with models to review the  exercise physiology of the cardiovascular system and associated critical values. Provides general exercise guidelines with specific guidelines to those with heart or lung disease.    Education: Flexibility, Balance, Mind/Body Relaxation: Provides group verbal/written instruction on the benefits of flexibility and balance training, including mind/body exercise modes such as yoga, pilates and tai chi.  Demonstration and skill practice provided.   Activity Barriers & Risk Stratification:  Activity Barriers & Cardiac Risk Stratification - 04/10/20 1638        Activity Barriers & Cardiac Risk Stratification   Activity Barriers Arthritis   Arthritis left foot   Cardiac Risk Stratification Moderate           6 Minute Walk:  6 Minute Walk    Row Name 04/10/20 1626         6 Minute Walk   Phase Initial     Distance 1472 feet     Walk Time 6 minutes     # of Rest Breaks 0     MPH 2.78     METS 3.25     RPE 9     Perceived Dyspnea  0     VO2 Peak 11.4     Symptoms No     Resting HR 58 bpm     Resting BP 118/70     Resting Oxygen Saturation  97 %     Exercise Oxygen Saturation  during 6 min walk 98 %     Max Ex. HR 85 bpm     Max Ex. BP 142/74     2 Minute Post BP 120/68            Oxygen Initial Assessment:   Oxygen Re-Evaluation:   Oxygen Discharge (Final Oxygen Re-Evaluation):   Initial Exercise Prescription:  Initial Exercise Prescription - 04/10/20 1600      Date of Initial Exercise RX and Referring Provider   Date 04/10/20    Referring Provider Dr. Lujean Amel      Treadmill   MPH 2.5    Grade 0.5    Minutes 15    METs 3.09      NuStep   Level 3    SPM 80    Minutes 15    METs 3.2      REL-XR   Level 2    Speed 50    Minutes 15    METs 3.2      T5 Nustep   Level 2    SPM 80    Minutes 15    METs 3.2      Biostep-RELP   Level 2    SPM 50    Minutes 15    METs 3.2      Prescription Details   Frequency (times per week) 2    Duration Progress to 30 minutes of continuous aerobic without signs/symptoms of physical distress      Intensity   THRR 40-80% of Max Heartrate 96-135    Ratings of Perceived Exertion 11-13    Perceived Dyspnea 0-4      Progression   Progression Continue to progress workloads to maintain intensity without signs/symptoms of physical distress.      Resistance Training   Training Prescription Yes    Weight 3 lb    Reps 10-15           Perform Capillary Blood Glucose checks as needed.  Exercise Prescription Changes:  Exercise Prescription  Changes    Row Name 04/10/20 1600  Response to Exercise   Blood Pressure (Admit) 118/70       Blood Pressure (Exercise) 142/74       Blood Pressure (Exit) 120/68       Heart Rate (Admit) 58 bpm       Heart Rate (Exercise) 85 bpm       Heart Rate (Exit) 63 bpm       Oxygen Saturation (Admit) 97 %       Oxygen Saturation (Exercise) 98 %       Oxygen Saturation (Exit) 99 %       Rating of Perceived Exertion (Exercise) 9       Perceived Dyspnea (Exercise) 0       Symptoms none       Comments walk test results       Duration Progress to 30 minutes of  aerobic without signs/symptoms of physical distress              Exercise Comments:   Exercise Goals and Review:  Exercise Goals    Row Name 04/10/20 1636             Exercise Goals   Increase Physical Activity Yes       Intervention Provide advice, education, support and counseling about physical activity/exercise needs.;Develop an individualized exercise prescription for aerobic and resistive training based on initial evaluation findings, risk stratification, comorbidities and participant's personal goals.       Expected Outcomes Short Term: Attend rehab on a regular basis to increase amount of physical activity.;Long Term: Add in home exercise to make exercise part of routine and to increase amount of physical activity.;Long Term: Exercising regularly at least 3-5 days a week.       Increase Strength and Stamina Yes       Intervention Provide advice, education, support and counseling about physical activity/exercise needs.;Develop an individualized exercise prescription for aerobic and resistive training based on initial evaluation findings, risk stratification, comorbidities and participant's personal goals.       Expected Outcomes Short Term: Increase workloads from initial exercise prescription for resistance, speed, and METs.;Short Term: Perform resistance training exercises routinely during rehab and add in  resistance training at home;Long Term: Improve cardiorespiratory fitness, muscular endurance and strength as measured by increased METs and functional capacity (6MWT)       Able to understand and use rate of perceived exertion (RPE) scale Yes       Intervention Provide education and explanation on how to use RPE scale       Expected Outcomes Short Term: Able to use RPE daily in rehab to express subjective intensity level;Long Term:  Able to use RPE to guide intensity level when exercising independently       Able to understand and use Dyspnea scale Yes       Intervention Provide education and explanation on how to use Dyspnea scale       Expected Outcomes Short Term: Able to use Dyspnea scale daily in rehab to express subjective sense of shortness of breath during exertion;Long Term: Able to use Dyspnea scale to guide intensity level when exercising independently       Knowledge and understanding of Target Heart Rate Range (THRR) Yes       Intervention Provide education and explanation of THRR including how the numbers were predicted and where they are located for reference       Expected Outcomes Short Term: Able to state/look up THRR;Short Term: Able to use daily as  guideline for intensity in rehab;Long Term: Able to use THRR to govern intensity when exercising independently       Able to check pulse independently Yes       Intervention Provide education and demonstration on how to check pulse in carotid and radial arteries.;Review the importance of being able to check your own pulse for safety during independent exercise       Expected Outcomes Short Term: Able to explain why pulse checking is important during independent exercise;Long Term: Able to check pulse independently and accurately       Understanding of Exercise Prescription Yes       Intervention Provide education, explanation, and written materials on patient's individual exercise prescription       Expected Outcomes Short Term: Able to  explain program exercise prescription;Long Term: Able to explain home exercise prescription to exercise independently              Exercise Goals Re-Evaluation :   Discharge Exercise Prescription (Final Exercise Prescription Changes):  Exercise Prescription Changes - 04/10/20 1600      Response to Exercise   Blood Pressure (Admit) 118/70    Blood Pressure (Exercise) 142/74    Blood Pressure (Exit) 120/68    Heart Rate (Admit) 58 bpm    Heart Rate (Exercise) 85 bpm    Heart Rate (Exit) 63 bpm    Oxygen Saturation (Admit) 97 %    Oxygen Saturation (Exercise) 98 %    Oxygen Saturation (Exit) 99 %    Rating of Perceived Exertion (Exercise) 9    Perceived Dyspnea (Exercise) 0    Symptoms none    Comments walk test results    Duration Progress to 30 minutes of  aerobic without signs/symptoms of physical distress           Nutrition:  Target Goals: Understanding of nutrition guidelines, daily intake of sodium <1573m, cholesterol <2056m calories 30% from fat and 7% or less from saturated fats, daily to have 5 or more servings of fruits and vegetables.  Education: Controlling Sodium/Reading Food Labels -Group verbal and written material supporting the discussion of sodium use in heart healthy nutrition. Review and explanation with models, verbal and written materials for utilization of the food label.   Education: General Nutrition Guidelines/Fats and Fiber: -Group instruction provided by verbal, written material, models and posters to present the general guidelines for heart healthy nutrition. Gives an explanation and review of dietary fats and fiber.   Biometrics:  Pre Biometrics - 04/10/20 1636      Pre Biometrics   Height 5' 8.2" (1.732 m)    Weight 199 lb (90.3 kg)    BMI (Calculated) 30.09    Single Leg Stand 30 seconds            Nutrition Therapy Plan and Nutrition Goals:   Nutrition Assessments:  Nutrition Assessments - 04/10/20 1621      MEDFICTS  Scores   Pre Score 79           MEDIFICTS Score Key:          ?70 Need to make dietary changes          40-70 Heart Healthy Diet         ? 40 Therapeutic Level Cholesterol Diet  Nutrition Goals Re-Evaluation:   Nutrition Goals Discharge (Final Nutrition Goals Re-Evaluation):   Psychosocial: Target Goals: Acknowledge presence or absence of significant depression and/or stress, maximize coping skills, provide positive support system. Participant is able to  verbalize types and ability to use techniques and skills needed for reducing stress and depression.   Education: Depression - Provides group verbal and written instruction on the correlation between heart/lung disease and depressed mood, treatment options, and the stigmas associated with seeking treatment.   Education: Sleep Hygiene -Provides group verbal and written instruction about how sleep can affect your health.  Define sleep hygiene, discuss sleep cycles and impact of sleep habits. Review good sleep hygiene tips.     Education: Stress and Anxiety: - Provides group verbal and written instruction about the health risks of elevated stress and causes of high stress.  Discuss the correlation between heart/lung disease and anxiety and treatment options. Review healthy ways to manage with stress and anxiety.    Initial Review & Psychosocial Screening:  Initial Psych Review & Screening - 04/08/20 0832      Initial Review   Current issues with None Identified      Family Dynamics   Good Support System? Yes    Comments He can look to his wife, daugher and family for support. He has a positive outlook on his health.      Barriers   Psychosocial barriers to participate in program There are no identifiable barriers or psychosocial needs.      Screening Interventions   Interventions Encouraged to exercise;Provide feedback about the scores to participant;To provide support and resources with identified psychosocial needs     Expected Outcomes Short Term goal: Utilizing psychosocial counselor, staff and physician to assist with identification of specific Stressors or current issues interfering with healing process. Setting desired goal for each stressor or current issue identified.;Long Term Goal: Stressors or current issues are controlled or eliminated.;Short Term goal: Identification and review with participant of any Quality of Life or Depression concerns found by scoring the questionnaire.;Long Term goal: The participant improves quality of Life and PHQ9 Scores as seen by post scores and/or verbalization of changes           Quality of Life Scores:   Quality of Life - 04/10/20 1625      Quality of Life   Select Quality of Life      Quality of Life Scores   Health/Function Pre 21.77 %    Socioeconomic Pre 24.56 %    Psych/Spiritual Pre 23.07 %    Family Pre 30 %    GLOBAL Pre 23.84 %          Scores of 19 and below usually indicate a poorer quality of life in these areas.  A difference of  2-3 points is a clinically meaningful difference.  A difference of 2-3 points in the total score of the Quality of Life Index has been associated with significant improvement in overall quality of life, self-image, physical symptoms, and general health in studies assessing change in quality of life.  PHQ-9: Recent Review Flowsheet Data    Depression screen [facility] 2/9 04/10/2020   Decreased Interest 1   Down, Depressed, Hopeless 1   PHQ - 2 Score 2   Altered sleeping 0   Tired, decreased energy 0   Change in appetite 0   Feeling bad or failure about yourself  0   Trouble concentrating 0   Moving slowly or fidgety/restless 0   Suicidal thoughts 0   PHQ-9 Score 2   Difficult doing work/chores Not difficult at all     Interpretation of Total Score  Total Score Depression Severity:  1-4 = Minimal depression, 5-9 = Mild depression,  10-14 = Moderate depression, 15-19 = Moderately severe depression, 20-27 = Severe  depression   Psychosocial Evaluation and Intervention:  Psychosocial Evaluation - 04/08/20 0833      Psychosocial Evaluation & Interventions   Interventions Encouraged to exercise with the program and follow exercise prescription    Comments He can look to his wife, daugher and family for support. He has a positive outlook on his health.    Expected Outcomes Short: Exercise regularly to support mental health and notify staff of any changes. Long: maintain mental health and well being through teaching of rehab or prescribed medications independently.    Continue Psychosocial Services  Follow up required by staff           Psychosocial Re-Evaluation:   Psychosocial Discharge (Final Psychosocial Re-Evaluation):   Vocational Rehabilitation: Provide vocational rehab assistance to qualifying candidates.   Vocational Rehab Evaluation & Intervention:   Education: Education Goals: Education classes will be provided on a variety of topics geared toward better understanding of heart health and risk factor modification. Participant will state understanding/return demonstration of topics presented as noted by education test scores.  Learning Barriers/Preferences:  Learning Barriers/Preferences - 04/08/20 0831      Learning Barriers/Preferences   Learning Barriers None    Learning Preferences None           General Cardiac Education Topics:  AED/CPR: - Group verbal and written instruction with the use of models to demonstrate the basic use of the AED with the basic ABC's of resuscitation.   Anatomy & Physiology of the Heart: - Group verbal and written instruction and models provide basic cardiac anatomy and physiology, with the coronary electrical and arterial systems. Review of Valvular disease and Heart Failure   Cardiac Procedures: - Group verbal and written instruction to review commonly prescribed medications for heart disease. Reviews the medication, class of the drug, and  side effects. Includes the steps to properly store meds and maintain the prescription regimen. (beta blockers and nitrates)   Cardiac Medications I: - Group verbal and written instruction to review commonly prescribed medications for heart disease. Reviews the medication, class of the drug, and side effects. Includes the steps to properly store meds and maintain the prescription regimen.   Cardiac Medications II: -Group verbal and written instruction to review commonly prescribed medications for heart disease. Reviews the medication, class of the drug, and side effects. (all other drug classes)    Go Sex-Intimacy & Heart Disease, Get SMART - Goal Setting: - Group verbal and written instruction through game format to discuss heart disease and the return to sexual intimacy. Provides group verbal and written material to discuss and apply goal setting through the application of the S.M.A.R.T. Method.   Other Matters of the Heart: - Provides group verbal, written materials and models to describe Stable Angina and Peripheral Artery. Includes description of the disease process and treatment options available to the cardiac patient.   Infection Prevention: - Provides verbal and written material to individual with discussion of infection control including proper hand washing and proper equipment cleaning during exercise session.   Cardiac Rehab from 04/08/2020 in [facility] Cardiac and Pulmonary Rehab  Date 04/08/20  Educator [facility]  Instruction Review Code 1- Verbalizes Understanding      Falls Prevention: - Provides verbal and written material to individual with discussion of falls prevention and safety.   Cardiac Rehab from 04/08/2020 in [facility] Cardiac and Pulmonary Rehab  Date 04/08/20  Educator [facility]  Instruction Review Code 1-  Verbalizes Understanding      Other: -Provides group and verbal instruction on various topics (see comments)   Knowledge Questionnaire Score:  Knowledge Questionnaire  Score - 04/10/20 1622      Knowledge Questionnaire Score   Pre Score 21/26: Heart rate, heart failure, nutrition, exercise           Core Components/Risk Factors/Patient Goals at Admission:  Personal Goals and Risk Factors at Admission - 04/10/20 1637      Core Components/Risk Factors/Patient Goals on Admission    Weight Management Yes;Weight Loss    Intervention Weight Management: Develop a combined nutrition and exercise program designed to reach desired caloric intake, while maintaining appropriate intake of nutrient and fiber, sodium and fats, and appropriate energy expenditure required for the weight goal.;Weight Management: Provide education and appropriate resources to help participant work on and attain dietary goals.;Weight Management/Obesity: Establish reasonable short term and long term weight goals.    Admit Weight 199 lb (90.3 kg)    Goal Weight: Short Term 194 lb (88 kg)    Goal Weight: Long Term 189 lb (85.7 kg)    Expected Outcomes Short Term: Continue to assess and modify interventions until short term weight is achieved;Long Term: Adherence to nutrition and physical activity/exercise program aimed toward attainment of established weight goal;Weight Loss: Understanding of general recommendations for a balanced deficit meal plan, which promotes 1-2 lb weight loss per week and includes a negative energy balance of [phone] kcal/d;Understanding recommendations for meals to include 15-35% energy as protein, 25-35% energy from fat, 35-60% energy from carbohydrates, less than 249m of dietary cholesterol, 20-35 gm of total fiber daily;Understanding of distribution of calorie intake throughout the day with the consumption of 4-5 meals/snacks    Hypertension Yes    Intervention Monitor prescription use compliance.;Provide education on lifestyle modifcations including regular physical activity/exercise, weight management, moderate sodium restriction and increased consumption of fresh  fruit, vegetables, and low fat dairy, alcohol moderation, and smoking cessation.    Expected Outcomes Short Term: Continued assessment and intervention until BP is < 140/957mHG in hypertensive participants. < 130/8049mG in hypertensive participants with diabetes, heart failure or chronic kidney disease.;Long Term: Maintenance of blood pressure at goal levels.    Lipids Yes    Intervention Provide education and support for participant on nutrition & aerobic/resistive exercise along with prescribed medications to achieve LDL <21m46mDL >40mg29m Expected Outcomes Short Term: Participant states understanding of desired cholesterol values and is compliant with medications prescribed. Participant is following exercise prescription and nutrition guidelines.;Long Term: Cholesterol controlled with medications as prescribed, with individualized exercise RX and with personalized nutrition plan. Value goals: LDL < 21mg,65m > 40 mg.           Education:Diabetes - Individual verbal and written instruction to review signs/symptoms of diabetes, desired ranges of glucose level fasting, after meals and with exercise. Acknowledge that pre and post exercise glucose checks will be done for 3 sessions at entry of program.   Education: Know Your Numbers and Risk Factors: -Group verbal and written instruction about important numbers in your health.  Discussion of what are risk factors and how they play a role in the disease process.  Review of Cholesterol, Blood Pressure, Diabetes, and BMI and the role they play in your overall health.   Core Components/Risk Factors/Patient Goals Review:    Core Components/Risk Factors/Patient Goals at Discharge (Final Review):    ITP Comments:  ITP Comments    Row Name  04/08/20 0855           ITP Comments Virtual Visit completed. Patient informed on EP and RD appointment and 6 Minute walk test. Patient also informed of patient health questionnaires on My Chart. Patient  Verbalizes understanding. Visit diagnosis can be found in [facility] 12/27/2019.              Comments: Initial ITP

## 2020-04-10 NOTE — Patient Instructions (Signed)
Patient Instructions  Patient Details  Name: Richard Webster MRN: [id number] Date of Birth: 06-25-1956 Referring Provider:  Yolonda Kida, MD  Below are your personal goals for exercise, nutrition, and risk factors. Our goal is to help you stay on track towards obtaining and maintaining these goals. We will be discussing your progress on these goals with you throughout the program.  Initial Exercise Prescription:  Initial Exercise Prescription - 04/10/20 1600      Date of Initial Exercise RX and Referring Provider   Date 04/10/20    Referring Provider Dr. Lujean Amel      Treadmill   MPH 2.5    Grade 0.5    Minutes 15    METs 3.09      NuStep   Level 3    SPM 80    Minutes 15    METs 3.2      REL-XR   Level 2    Speed 50    Minutes 15    METs 3.2      T5 Nustep   Level 2    SPM 80    Minutes 15    METs 3.2      Biostep-RELP   Level 2    SPM 50    Minutes 15    METs 3.2      Prescription Details   Frequency (times per week) 2    Duration Progress to 30 minutes of continuous aerobic without signs/symptoms of physical distress      Intensity   THRR 40-80% of Max Heartrate 96-135    Ratings of Perceived Exertion 11-13    Perceived Dyspnea 0-4      Progression   Progression Continue to progress workloads to maintain intensity without signs/symptoms of physical distress.      Resistance Training   Training Prescription Yes    Weight 3 lb    Reps 10-15           Exercise Goals: Frequency: Be able to perform aerobic exercise two to three times per week in program working toward 2-5 days per week of home exercise.  Intensity: Work with a perceived exertion of 11 (fairly light) - 15 (hard) while following your exercise prescription.  We will make changes to your prescription with you as you progress through the program.   Duration: Be able to do 30 to 45 minutes of continuous aerobic exercise in addition to a 5 minute warm-up and a 5 minute  cool-down routine.   Nutrition Goals: Your personal nutrition goals will be established when you do your nutrition analysis with the dietician.  The following are general nutrition guidelines to follow: Cholesterol < 200mg /day Sodium < 1500mg /day Fiber: Men over 50 yrs - 30 grams per day  Personal Goals:  Personal Goals and Risk Factors at Admission - 04/10/20 1637      Core Components/Risk Factors/Patient Goals on Admission    Weight Management Yes;Weight Loss    Intervention Weight Management: Develop a combined nutrition and exercise program designed to reach desired caloric intake, while maintaining appropriate intake of nutrient and fiber, sodium and fats, and appropriate energy expenditure required for the weight goal.;Weight Management: Provide education and appropriate resources to help participant work on and attain dietary goals.;Weight Management/Obesity: Establish reasonable short term and long term weight goals.    Admit Weight 199 lb (90.3 kg)    Goal Weight: Short Term 194 lb (88 kg)    Goal Weight: Long Term 189 lb (85.7 kg)  Expected Outcomes Short Term: Continue to assess and modify interventions until short term weight is achieved;Long Term: Adherence to nutrition and physical activity/exercise program aimed toward attainment of established weight goal;Weight Loss: Understanding of general recommendations for a balanced deficit meal plan, which promotes 1-2 lb weight loss per week and includes a negative energy balance of [phone] kcal/d;Understanding recommendations for meals to include 15-35% energy as protein, 25-35% energy from fat, 35-60% energy from carbohydrates, less than 200mg  of dietary cholesterol, 20-35 gm of total fiber daily;Understanding of distribution of calorie intake throughout the day with the consumption of 4-5 meals/snacks    Hypertension Yes    Intervention Monitor prescription use compliance.;Provide education on lifestyle modifcations including  regular physical activity/exercise, weight management, moderate sodium restriction and increased consumption of fresh fruit, vegetables, and low fat dairy, alcohol moderation, and smoking cessation.    Expected Outcomes Short Term: Continued assessment and intervention until BP is < 140/64mm HG in hypertensive participants. < 130/79mm HG in hypertensive participants with diabetes, heart failure or chronic kidney disease.;Long Term: Maintenance of blood pressure at goal levels.    Lipids Yes    Intervention Provide education and support for participant on nutrition & aerobic/resistive exercise along with prescribed medications to achieve LDL 70mg , HDL >40mg .    Expected Outcomes Short Term: Participant states understanding of desired cholesterol values and is compliant with medications prescribed. Participant is following exercise prescription and nutrition guidelines.;Long Term: Cholesterol controlled with medications as prescribed, with individualized exercise RX and with personalized nutrition plan. Value goals: LDL < 70mg , HDL > 40 mg.           Tobacco Use Initial Evaluation: Social History   Tobacco Use  Smoking Status Former Smoker   Packs/day: 0.50   Years: 15.00   Pack years: 7.50   Types: Cigarettes   Quit date: 09/04/1983   Years since quitting: 36.6  Smokeless Tobacco Former User    Exercise Goals and Review:  Exercise Goals    Row Name 04/10/20 1636             Exercise Goals   Increase Physical Activity Yes       Intervention Provide advice, education, support and counseling about physical activity/exercise needs.;Develop an individualized exercise prescription for aerobic and resistive training based on initial evaluation findings, risk stratification, comorbidities and participant's personal goals.       Expected Outcomes Short Term: Attend rehab on a regular basis to increase amount of physical activity.;Long Term: Add in home exercise to make exercise part of  routine and to increase amount of physical activity.;Long Term: Exercising regularly at least 3-5 days a week.       Increase Strength and Stamina Yes       Intervention Provide advice, education, support and counseling about physical activity/exercise needs.;Develop an individualized exercise prescription for aerobic and resistive training based on initial evaluation findings, risk stratification, comorbidities and participant's personal goals.       Expected Outcomes Short Term: Increase workloads from initial exercise prescription for resistance, speed, and METs.;Short Term: Perform resistance training exercises routinely during rehab and add in resistance training at home;Long Term: Improve cardiorespiratory fitness, muscular endurance and strength as measured by increased METs and functional capacity (6MWT)       Able to understand and use rate of perceived exertion (RPE) scale Yes       Intervention Provide education and explanation on how to use RPE scale       Expected Outcomes Short Term:  Able to use RPE daily in rehab to express subjective intensity level;Long Term:  Able to use RPE to guide intensity level when exercising independently       Able to understand and use Dyspnea scale Yes       Intervention Provide education and explanation on how to use Dyspnea scale       Expected Outcomes Short Term: Able to use Dyspnea scale daily in rehab to express subjective sense of shortness of breath during exertion;Long Term: Able to use Dyspnea scale to guide intensity level when exercising independently       Knowledge and understanding of Target Heart Rate Range (THRR) Yes       Intervention Provide education and explanation of THRR including how the numbers were predicted and where they are located for reference       Expected Outcomes Short Term: Able to state/look up THRR;Short Term: Able to use daily as guideline for intensity in rehab;Long Term: Able to use THRR to govern intensity when  exercising independently       Able to check pulse independently Yes       Intervention Provide education and demonstration on how to check pulse in carotid and radial arteries.;Review the importance of being able to check your own pulse for safety during independent exercise       Expected Outcomes Short Term: Able to explain why pulse checking is important during independent exercise;Long Term: Able to check pulse independently and accurately       Understanding of Exercise Prescription Yes       Intervention Provide education, explanation, and written materials on patient's individual exercise prescription       Expected Outcomes Short Term: Able to explain program exercise prescription;Long Term: Able to explain home exercise prescription to exercise independently              Copy of goals given to participant.

## 2020-04-14 ENCOUNTER — Encounter: Payer: Medicare Other | Admitting: *Deleted

## 2020-04-14 ENCOUNTER — Other Ambulatory Visit: Payer: Self-pay

## 2020-04-14 DIAGNOSIS — Z955 Presence of coronary angioplasty implant and graft: Secondary | ICD-10-CM | POA: Diagnosis not present

## 2020-04-14 NOTE — Progress Notes (Signed)
Daily Session Note  Patient Details  Name: RUAL VERMEER MRN: [id number] Date of Birth: 1956-06-11 Referring Provider:     Cardiac Rehab from 04/10/2020 in [facility] Cardiac and Pulmonary Rehab  Referring Provider Dr. Lujean Amel      Encounter Date: 04/14/2020  Check In:  Session Check In - 04/14/20 1358      Check-In   Supervising physician immediately available to respond to emergencies See telemetry face sheet for immediately available ER MD    Location [facility]-Cardiac & Pulmonary Rehab    Staff Present Earlean Shawl, BS, ACSM CEP, Exercise Physiologist;Melissa Caiola RDN, LDN;Fadil Macmaster Sherryll Burger, RN Margurite Auerbach, MS Exercise Physiologist    Virtual Visit No    Medication changes reported     No    Fall or balance concerns reported    No    Warm-up and Cool-down Performed on first and last piece of equipment    Resistance Training Performed Yes    VAD Patient? No    PAD/SET Patient? No      Pain Assessment   Currently in Pain? No/denies              Social History   Tobacco Use  Smoking Status Former Smoker  . Packs/day: 0.50  . Years: 15.00  . Pack years: 7.50  . Types: Cigarettes  . Quit date: 09/04/1983  . Years since quitting: 36.6  Smokeless Tobacco Former Environmental health practitioner Met:  Independence with exercise equipment Exercise tolerated well No report of cardiac concerns or symptoms Strength training completed today  Goals Unmet:  Not Applicable  Comments: First full day of exercise!  Patient was oriented to gym and equipment including functions, settings, policies, and procedures.  Patient's individual exercise prescription and treatment plan were reviewed.  All starting workloads were established based on the results of the 6 minute walk test done at initial orientation visit.  The plan for exercise progression was also introduced and progression will be customized based on patient's performance and goals.     Dr. Emily Filbert is Medical Director for  [place] and [facility].

## 2020-04-16 ENCOUNTER — Other Ambulatory Visit: Payer: Self-pay

## 2020-04-16 ENCOUNTER — Encounter: Payer: Medicare Other | Admitting: *Deleted

## 2020-04-16 DIAGNOSIS — Z955 Presence of coronary angioplasty implant and graft: Secondary | ICD-10-CM | POA: Diagnosis not present

## 2020-04-16 NOTE — Progress Notes (Signed)
Daily Session Note  Patient Details  Name: LEM PEARY MRN: [id number] Date of Birth: 08/11/1955 Referring Provider:     Cardiac Rehab from 04/10/2020 in [facility] Cardiac and Pulmonary Rehab  Referring Provider Dr. Lujean Amel      Encounter Date: 04/16/2020  Check In:  Session Check In - 04/16/20 1414      Check-In   Supervising physician immediately available to respond to emergencies See telemetry face sheet for immediately available ER MD    Location [facility]-Cardiac & Pulmonary Rehab    Staff Present Renita Papa, RN BSN;Joseph Lou Miner, [place] Exercise Physiologist    Virtual Visit No    Medication changes reported     No    Fall or balance concerns reported    No    Warm-up and Cool-down Performed on first and last piece of equipment    Resistance Training Performed Yes    VAD Patient? No    PAD/SET Patient? No      Pain Assessment   Currently in Pain? No/denies              Social History   Tobacco Use  Smoking Status Former Smoker  . Packs/day: 0.50  . Years: 15.00  . Pack years: 7.50  . Types: Cigarettes  . Quit date: 09/04/1983  . Years since quitting: 36.6  Smokeless Tobacco Former Environmental health practitioner Met:  Proper associated with RPD/PD & O2 Sat Independence with exercise equipment Exercise tolerated well No report of cardiac concerns or symptoms Strength training completed today  Goals Unmet:  Not Applicable  Comments: Pt able to follow exercise prescription today without complaint.  Will continue to monitor for progression.    Dr. Emily Filbert is Medical Director for [place] and [facility].

## 2020-04-21 ENCOUNTER — Encounter: Payer: Medicare Other | Admitting: *Deleted

## 2020-04-21 ENCOUNTER — Other Ambulatory Visit: Payer: Self-pay

## 2020-04-21 DIAGNOSIS — Z955 Presence of coronary angioplasty implant and graft: Secondary | ICD-10-CM | POA: Diagnosis not present

## 2020-04-21 NOTE — Progress Notes (Signed)
Daily Session Note  Patient Details  Name: Richard Webster MRN: [id number] Date of Birth: 02/01/1956 Referring Provider:     Cardiac Rehab from 04/10/2020 in [facility] Cardiac and Pulmonary Rehab  Referring Provider Dr. Dwayne Callwood      Encounter Date: 04/21/2020  Check In:  Session Check In - 04/21/20 1359      Check-In   Supervising physician immediately available to respond to emergencies See telemetry face sheet for immediately available ER MD    Location [facility]-Cardiac & Pulmonary Rehab    Staff Present Meredith Craven, RN BSN;Kara Langdon, MS Exercise Physiologist;Kelly Hayes, BS, ACSM CEP, Exercise Physiologist    Virtual Visit No    Medication changes reported     No    Fall or balance concerns reported    No    Warm-up and Cool-down Performed on first and last piece of equipment    Resistance Training Performed Yes    VAD Patient? No    PAD/SET Patient? No      Pain Assessment   Currently in Pain? No/denies              Social History   Tobacco Use  Smoking Status Former Smoker  . Packs/day: 0.50  . Years: 15.00  . Pack years: 7.50  . Types: Cigarettes  . Quit date: 09/04/1983  . Years since quitting: 36.6  Smokeless Tobacco Former User    Goals Met:  Independence with exercise equipment Exercise tolerated well No report of cardiac concerns or symptoms Strength training completed today  Goals Unmet:  Not Applicable  Comments: Pt able to follow exercise prescription today without complaint.  Will continue to monitor for progression.    Dr. Mark Miller is Medical Director for [facility] and [facility]. 

## 2020-04-23 ENCOUNTER — Other Ambulatory Visit: Payer: Self-pay

## 2020-04-23 ENCOUNTER — Encounter: Payer: Medicare Other | Admitting: *Deleted

## 2020-04-23 DIAGNOSIS — Z955 Presence of coronary angioplasty implant and graft: Secondary | ICD-10-CM | POA: Diagnosis not present

## 2020-04-23 NOTE — Progress Notes (Signed)
Daily Session Note  Patient Details  Name: Richard Webster MRN: [id number] Date of Birth: [birth year] Referring Provider:     Cardiac Rehab from 04/10/2020 in [facility] Cardiac and Pulmonary Rehab  Referring Provider Dr. Lujean Amel      Encounter Date: 04/23/2020  Check In:  Session Check In - 04/23/20 1455      Check-In   Supervising physician immediately available to respond to emergencies See telemetry face sheet for immediately available ER MD    Location [facility]-Cardiac & Pulmonary Rehab    Staff Present Renita Papa, RN BSN;Joseph Lou Miner, [place] Exercise Physiologist;Amanda Oletta Darter, [place], ACSM CEP, Exercise Physiologist    Virtual Visit No    Medication changes reported     No    Fall or balance concerns reported    No    Warm-up and Cool-down Performed on first and last piece of equipment    Resistance Training Performed Yes    VAD Patient? No    PAD/SET Patient? No      Pain Assessment   Currently in Pain? No/denies              Social History   Tobacco Use  Smoking Status Former Smoker  . Packs/day: 0.50  . Years: 15.00  . Pack years: 7.50  . Types: Cigarettes  . Quit date: 09/04/1983  . Years since quitting: 36.6  Smokeless Tobacco Former Environmental health practitioner Met:  Independence with exercise equipment Exercise tolerated well No report of cardiac concerns or symptoms Strength training completed today  Goals Unmet:  Not Applicable  Comments: Pt able to follow exercise prescription today without complaint.  Will continue to monitor for progression.    Dr. Emily Filbert is Medical Director for [place] and [facility].

## 2020-04-28 ENCOUNTER — Other Ambulatory Visit: Payer: Self-pay

## 2020-04-28 ENCOUNTER — Encounter: Payer: Medicare Other | Attending: Internal Medicine | Admitting: *Deleted

## 2020-04-28 DIAGNOSIS — Z955 Presence of coronary angioplasty implant and graft: Secondary | ICD-10-CM | POA: Diagnosis present

## 2020-04-28 NOTE — Progress Notes (Signed)
Daily Session Note  Patient Details  Name: Richard Webster MRN: [id number] Date of Birth: December 31, 1955 Referring Provider:     Cardiac Rehab from 04/10/2020 in [facility] Cardiac and Pulmonary Rehab  Referring Provider Dr. Lujean Amel      Encounter Date: 04/28/2020  Check In:  Session Check In - 04/28/20 1417      Check-In   Supervising physician immediately available to respond to emergencies See telemetry face sheet for immediately available ER MD    Location [facility]-Cardiac & Pulmonary Rehab    Staff Present Renita Papa, RN Margurite Auerbach, MS Exercise Physiologist;Kelly Amedeo Plenty, BS, ACSM CEP, Exercise Physiologist    Virtual Visit No    Medication changes reported     No    Fall or balance concerns reported    No    Warm-up and Cool-down Performed on first and last piece of equipment    Resistance Training Performed Yes    VAD Patient? No    PAD/SET Patient? No      Pain Assessment   Currently in Pain? No/denies              Social History   Tobacco Use  Smoking Status Former Smoker  . Packs/day: 0.50  . Years: 15.00  . Pack years: 7.50  . Types: Cigarettes  . Quit date: 09/04/1983  . Years since quitting: 36.6  Smokeless Tobacco Former Environmental health practitioner Met:  Independence with exercise equipment Exercise tolerated well No report of cardiac concerns or symptoms Strength training completed today  Goals Unmet:  Not Applicable  Comments: Pt able to follow exercise prescription today without complaint.  Will continue to monitor for progression.    Dr. Emily Filbert is Medical Director for [place] and [facility].

## 2020-04-30 ENCOUNTER — Encounter: Payer: Self-pay | Admitting: *Deleted

## 2020-04-30 ENCOUNTER — Other Ambulatory Visit: Payer: Self-pay

## 2020-04-30 ENCOUNTER — Encounter: Payer: Medicare Other | Admitting: *Deleted

## 2020-04-30 DIAGNOSIS — Z955 Presence of coronary angioplasty implant and graft: Secondary | ICD-10-CM | POA: Diagnosis not present

## 2020-04-30 NOTE — Progress Notes (Signed)
Daily Session Note  Patient Details  Name: Richard Webster MRN: [id number] Date of Birth: [birth year] Referring Provider:     Cardiac Rehab from 04/10/2020 in [facility] Card[facility] Rehab  Referring Provider Dr. Lujean Amel      Encounter Date: 04/30/2020  Check In:  Session Check In - 04/30/20 1412      Check-In   Supervising physician immediately available to respond to emergencies See telemetry face sheet for immediately available ER MD    Location [facility]-Cardiac & Pulmonary Rehab    Staff Present Renita Papa, RN BSN;Joseph Lou Miner, [place] Exercise Physiologist    Virtual Visit No    Medication changes reported     No    Fall or balance concerns reported    No    Warm-up and Cool-down Performed on first and last piece of equipment    Resistance Training Performed Yes    VAD Patient? No    PAD/SET Patient? No      Pain Assessment   Currently in Pain? No/denies              Social History   Tobacco Use  Smoking Status Former Smoker  . Packs/day: 0.50  . Years: 15.00  . Pack years: 7.50  . Types: Cigarettes  . Quit date: 09/04/1983  . Years since quitting: 36.6  Smokeless Tobacco Former Environmental health practitioner Met:  Independence with exercise equipment Exercise tolerated well No report of cardiac concerns or symptoms Strength training completed today  Goals Unmet:  Not Applicable  Comments: Pt able to follow exercise prescription today without complaint.  Will continue to monitor for progression.    Dr. Emily Filbert is Medical Director for [place] and [facility].

## 2020-04-30 NOTE — Progress Notes (Signed)
Cardiac Individual Treatment Plan  Patient Details  Name: Richard Webster MRN: [id number] Date of Birth: 1955-12-01 Referring Provider:     Cardiac Rehab from 04/10/2020 in [facility] Cardiac and Pulmonary Rehab  Referring Provider Dr. Lujean Amel      Initial Encounter Date:    Cardiac Rehab from 04/10/2020 in [facility] Cardiac and Pulmonary Rehab  Date 04/10/20      Visit Diagnosis: Status post coronary artery stent placement  Patient's Home Medications on Admission:  Current Outpatient Medications:  .  aspirin EC 81 MG tablet, Take 81 mg by mouth daily., Disp: , Rfl:  .  clopidogrel (PLAVIX) 75 MG tablet, Take by mouth., Disp: , Rfl:  .  etodolac (LODINE) 400 MG tablet, Take 400 mg by mouth daily., Disp: , Rfl:  .  naproxen sodium (ALEVE) 220 MG tablet, Take 220 mg by mouth daily as needed (pain). (Patient not taking: Reported on 04/08/2020), Disp: , Rfl:  .  nitroGLYCERIN (NITROSTAT) 0.4 MG SL tablet, Place under the tongue., Disp: , Rfl:  .  olmesartan (BENICAR) 20 MG tablet, Take by mouth., Disp: , Rfl:  .  olmesartan-hydrochlorothiazide (BENICAR HCT) 20-12.5 MG tablet, Take 1 tablet by mouth daily., Disp: , Rfl:  .  omeprazole (PRILOSEC) 40 MG capsule, Take 40 mg by mouth daily before breakfast., Disp: , Rfl:  .  pantoprazole (PROTONIX) 20 MG tablet, Take 1 tablet by mouth daily., Disp: , Rfl:  .  rosuvastatin (CRESTOR) 20 MG tablet, Take by mouth., Disp: , Rfl:   Past Medical History: Past Medical History:  Diagnosis Date  . Anxiety    pt denies  . Arthritis   . Atypical chest pain   . BPH (benign prostatic hyperplasia)   . Chronic kidney disease    kidney stones  . H/O rheumatic heart disease   . Heart murmur   . History of right bundle branch block (RBBB)   . Hypertension   . Palpitations   . Prostatitis   . Rheumatic fever     Tobacco Use: Social History   Tobacco Use  Smoking Status Former Smoker  . Packs/day: 0.50  . Years: 15.00  . Pack years: 7.50  .  Types: Cigarettes  . Quit date: 09/04/1983  . Years since quitting: 36.6  Smokeless Tobacco Former Geophysical data processor: Recent Chemical engineer   There is no flowsheet data to display.      Exercise Target Goals: Exercise Program Goal: Individual exercise prescription set using results from initial 6 min walk test and THRR while considering  patient's activity barriers and safety.   Exercise Prescription Goal: Initial exercise prescription builds to 30-45 minutes a day of aerobic activity, 2-3 days per week.  Home exercise guidelines will be given to patient during program as part of exercise prescription that the participant will acknowledge.   Education: Aerobic Exercise & Resistance Training: - Gives group verbal and written instruction on the various components of exercise. Focuses on aerobic and resistive training programs and the benefits of this training and how to safely progress through these programs..   Education: Exercise & Equipment Safety: - Individual verbal instruction and demonstration of equipment use and safety with use of the equipment.   Cardiac Rehab from 04/23/2020 in [facility] Cardiac and Pulmonary Rehab  Date 04/08/20  Educator [facility]  Instruction Review Code 1- Verbalizes Understanding      Education: Exercise Physiology & General Exercise Guidelines: - Group verbal and written instruction with models to review the  exercise physiology of the cardiovascular system and associated critical values. Provides general exercise guidelines with specific guidelines to those with heart or lung disease.    Education: Flexibility, Balance, Mind/Body Relaxation: Provides group verbal/written instruction on the benefits of flexibility and balance training, including mind/body exercise modes such as yoga, pilates and tai chi.  Demonstration and skill practice provided.   Activity Barriers & Risk Stratification:  Activity Barriers & Cardiac Risk Stratification - 04/10/20 1638        Activity Barriers & Cardiac Risk Stratification   Activity Barriers Arthritis   Arthritis left foot   Cardiac Risk Stratification Moderate           6 Minute Walk:  6 Minute Walk    Row Name 04/10/20 1626         6 Minute Walk   Phase Initial     Distance 1472 feet     Walk Time 6 minutes     # of Rest Breaks 0     MPH 2.78     METS 3.25     RPE 9     Perceived Dyspnea  0     VO2 Peak 11.4     Symptoms No     Resting HR 58 bpm     Resting BP 118/70     Resting Oxygen Saturation  97 %     Exercise Oxygen Saturation  during 6 min walk 98 %     Max Ex. HR 85 bpm     Max Ex. BP 142/74     2 Minute Post BP 120/68            Oxygen Initial Assessment:   Oxygen Re-Evaluation:   Oxygen Discharge (Final Oxygen Re-Evaluation):   Initial Exercise Prescription:  Initial Exercise Prescription - 04/10/20 1600      Date of Initial Exercise RX and Referring Provider   Date 04/10/20    Referring Provider Dr. Lujean Amel      Treadmill   MPH 2.5    Grade 0.5    Minutes 15    METs 3.09      NuStep   Level 3    SPM 80    Minutes 15    METs 3.2      REL-XR   Level 2    Speed 50    Minutes 15    METs 3.2      T5 Nustep   Level 2    SPM 80    Minutes 15    METs 3.2      Biostep-RELP   Level 2    SPM 50    Minutes 15    METs 3.2      Prescription Details   Frequency (times per week) 2    Duration Progress to 30 minutes of continuous aerobic without signs/symptoms of physical distress      Intensity   THRR 40-80% of Max Heartrate 96-135    Ratings of Perceived Exertion 11-13    Perceived Dyspnea 0-4      Progression   Progression Continue to progress workloads to maintain intensity without signs/symptoms of physical distress.      Resistance Training   Training Prescription Yes    Weight 3 lb    Reps 10-15           Perform Capillary Blood Glucose checks as needed.  Exercise Prescription Changes:  Exercise Prescription  Changes    Row Name 04/10/20 1600 04/16/20 1300  Response to Exercise   Blood Pressure (Admit) 118/70 138/72      Blood Pressure (Exercise) 142/74 158/76      Blood Pressure (Exit) 120/68 130/70      Heart Rate (Admit) 58 bpm 57 bpm      Heart Rate (Exercise) 85 bpm 100 bpm      Heart Rate (Exit) 63 bpm 71 bpm      Oxygen Saturation (Admit) 97 % --      Oxygen Saturation (Exercise) 98 % --      Oxygen Saturation (Exit) 99 % --      Rating of Perceived Exertion (Exercise) 9 12      Perceived Dyspnea (Exercise) 0 --      Symptoms none none      Comments walk test results --      Duration Progress to 30 minutes of  aerobic without signs/symptoms of physical distress --             Exercise Comments:   Exercise Goals and Review:  Exercise Goals    Row Name 04/10/20 1636             Exercise Goals   Increase Physical Activity Yes       Intervention Provide advice, education, support and counseling about physical activity/exercise needs.;Develop an individualized exercise prescription for aerobic and resistive training based on initial evaluation findings, risk stratification, comorbidities and participant's personal goals.       Expected Outcomes Short Term: Attend rehab on a regular basis to increase amount of physical activity.;Long Term: Add in home exercise to make exercise part of routine and to increase amount of physical activity.;Long Term: Exercising regularly at least 3-5 days a week.       Increase Strength and Stamina Yes       Intervention Provide advice, education, support and counseling about physical activity/exercise needs.;Develop an individualized exercise prescription for aerobic and resistive training based on initial evaluation findings, risk stratification, comorbidities and participant's personal goals.       Expected Outcomes Short Term: Increase workloads from initial exercise prescription for resistance, speed, and METs.;Short Term: Perform  resistance training exercises routinely during rehab and add in resistance training at home;Long Term: Improve cardiorespiratory fitness, muscular endurance and strength as measured by increased METs and functional capacity (6MWT)       Able to understand and use rate of perceived exertion (RPE) scale Yes       Intervention Provide education and explanation on how to use RPE scale       Expected Outcomes Short Term: Able to use RPE daily in rehab to express subjective intensity level;Long Term:  Able to use RPE to guide intensity level when exercising independently       Able to understand and use Dyspnea scale Yes       Intervention Provide education and explanation on how to use Dyspnea scale       Expected Outcomes Short Term: Able to use Dyspnea scale daily in rehab to express subjective sense of shortness of breath during exertion;Long Term: Able to use Dyspnea scale to guide intensity level when exercising independently       Knowledge and understanding of Target Heart Rate Range (THRR) Yes       Intervention Provide education and explanation of THRR including how the numbers were predicted and where they are located for reference       Expected Outcomes Short Term: Able to state/look up THRR;Short Term: Able to  use daily as guideline for intensity in rehab;Long Term: Able to use THRR to govern intensity when exercising independently       Able to check pulse independently Yes       Intervention Provide education and demonstration on how to check pulse in carotid and radial arteries.;Review the importance of being able to check your own pulse for safety during independent exercise       Expected Outcomes Short Term: Able to explain why pulse checking is important during independent exercise;Long Term: Able to check pulse independently and accurately       Understanding of Exercise Prescription Yes       Intervention Provide education, explanation, and written materials on patient's individual  exercise prescription       Expected Outcomes Short Term: Able to explain program exercise prescription;Long Term: Able to explain home exercise prescription to exercise independently              Exercise Goals Re-Evaluation :  Exercise Goals Re-Evaluation    Row Name 04/14/20 1400             Exercise Goal Re-Evaluation   Exercise Goals Review Increase Physical Activity;Able to understand and use rate of perceived exertion (RPE) scale;Knowledge and understanding of Target Heart Rate Range (THRR);Understanding of Exercise Prescription;Increase Strength and Stamina;Able to check pulse independently       Comments Reviewed PLB technique with pt.  Talked about how it works and it's importance in maintaining their exercise saturations.       Expected Outcomes Short: Become more profiecient at using PLB.   Long: Become independent at using PLB.              Discharge Exercise Prescription (Final Exercise Prescription Changes):  Exercise Prescription Changes - 04/16/20 1300      Response to Exercise   Blood Pressure (Admit) 138/72    Blood Pressure (Exercise) 158/76    Blood Pressure (Exit) 130/70    Heart Rate (Admit) 57 bpm    Heart Rate (Exercise) 100 bpm    Heart Rate (Exit) 71 bpm    Rating of Perceived Exertion (Exercise) 12    Symptoms none           Nutrition:  Target Goals: Understanding of nutrition guidelines, daily intake of sodium <153m, cholesterol <2031m calories 30% from fat and 7% or less from saturated fats, daily to have 5 or more servings of fruits and vegetables.  Education: Controlling Sodium/Reading Food Labels -Group verbal and written material supporting the discussion of sodium use in heart healthy nutrition. Review and explanation with models, verbal and written materials for utilization of the food label.   Education: General Nutrition Guidelines/Fats and Fiber: -Group instruction provided by verbal, written material, models and posters to  present the general guidelines for heart healthy nutrition. Gives an explanation and review of dietary fats and fiber.   Biometrics:  Pre Biometrics - 04/10/20 1636      Pre Biometrics   Height 5' 8.2" (1.732 m)    Weight 199 lb (90.3 kg)    BMI (Calculated) 30.09    Single Leg Stand 30 seconds            Nutrition Therapy Plan and Nutrition Goals:  Nutrition Therapy & Goals - 04/23/20 1333      Nutrition Therapy   Diet Heart healthy, Low Na    Drug/Food Interactions Statins/Certain Fruits    Protein (specify units) 75g    Fiber 30 grams  Whole Grain Foods 3 servings    Saturated Fats 12 max. grams    Fruits and Vegetables 5 servings/day    Sodium 1.5 grams      Personal Nutrition Goals   Nutrition Goal ST: increase vegetable intake and increase variety LT: Get stronger, feels like he is getting weaker and weaker. 7/10 right now    Comments Activity level has decreases. He feels like he is having muscle soreness. He will have a biscuit from [employer] for breakfast on the road. B: cheerios with banana and almond milk, restaurant breakfast, protein shake. L: meat with starch and vegetables D: same as lunch. Eats mostly red meat but will sometimes have chicken, fish and prok chops. Reports the vegetables he normally eats are potatoes and corn. Discussed variety and My[employer]. Discuseed heart healhty eating. Coffee with sweetener - asked about artificial sweeteners - he has GI distress with them, discussed how a moderate amount of sugar for his coffee can fit into a heart healthy diet - he does not like stevia.      Intervention Plan   Intervention Prescribe, educate and counsel regarding individualized specific dietary modifications aiming towards targeted core components such as weight, hypertension, lipid management, diabetes, heart failure and other comorbidities.;Nutrition handout(s) given to patient.    Expected Outcomes Short Term Goal: Understand basic principles of dietary  content, such as calories, fat, sodium, cholesterol and nutrients.;Short Term Goal: A plan has been developed with personal nutrition goals set during dietitian appointment.;Long Term Goal: Adherence to prescribed nutrition plan.           Nutrition Assessments:  Nutrition Assessments - 04/10/20 1621      MEDFICTS Scores   Pre Score 79           MEDIFICTS Score Key:          ?70 Need to make dietary changes          40-70 Heart Healthy Diet         ? 40 Therapeutic Level Cholesterol Diet  Nutrition Goals Re-Evaluation:   Nutrition Goals Discharge (Final Nutrition Goals Re-Evaluation):   Psychosocial: Target Goals: Acknowledge presence or absence of significant depression and/or stress, maximize coping skills, provide positive support system. Participant is able to verbalize types and ability to use techniques and skills needed for reducing stress and depression.   Education: Depression - Provides group verbal and written instruction on the correlation between heart/lung disease and depressed mood, treatment options, and the stigmas associated with seeking treatment.   Education: Sleep Hygiene -Provides group verbal and written instruction about how sleep can affect your health.  Define sleep hygiene, discuss sleep cycles and impact of sleep habits. Review good sleep hygiene tips.     Education: Stress and Anxiety: - Provides group verbal and written instruction about the health risks of elevated stress and causes of high stress.  Discuss the correlation between heart/lung disease and anxiety and treatment options. Review healthy ways to manage with stress and anxiety.    Initial Review & Psychosocial Screening:  Initial Psych Review & Screening - 04/08/20 0832      Initial Review   Current issues with None Identified      Family Dynamics   Good Support System? Yes    Comments He can look to his wife, daugher and family for support. He has a positive outlook on his  health.      Barriers   Psychosocial barriers to participate in program There are no identifiable barriers  or psychosocial needs.      Screening Interventions   Interventions Encouraged to exercise;Provide feedback about the scores to participant;To provide support and resources with identified psychosocial needs    Expected Outcomes Short Term goal: Utilizing psychosocial counselor, staff and physician to assist with identification of specific Stressors or current issues interfering with healing process. Setting desired goal for each stressor or current issue identified.;Long Term Goal: Stressors or current issues are controlled or eliminated.;Short Term goal: Identification and review with participant of any Quality of Life or Depression concerns found by scoring the questionnaire.;Long Term goal: The participant improves quality of Life and PHQ9 Scores as seen by post scores and/or verbalization of changes           Quality of Life Scores:   Quality of Life - 04/10/20 1625      Quality of Life   Select Quality of Life      Quality of Life Scores   Health/Function Pre 21.77 %    Socioeconomic Pre 24.56 %    Psych/Spiritual Pre 23.07 %    Family Pre 30 %    GLOBAL Pre 23.84 %          Scores of 19 and below usually indicate a poorer quality of life in these areas.  A difference of  2-3 points is a clinically meaningful difference.  A difference of 2-3 points in the total score of the Quality of Life Index has been associated with significant improvement in overall quality of life, self-image, physical symptoms, and general health in studies assessing change in quality of life.  PHQ-9: Recent Review Flowsheet Data    Depression screen [facility] 2/9 04/10/2020   Decreased Interest 1   Down, Depressed, Hopeless 1   PHQ - 2 Score 2   Altered sleeping 0   Tired, decreased energy 0   Change in appetite 0   Feeling bad or failure about yourself  0   Trouble concentrating 0   Moving  slowly or fidgety/restless 0   Suicidal thoughts 0   PHQ-9 Score 2   Difficult doing work/chores Not difficult at all     Interpretation of Total Score  Total Score Depression Severity:  1-4 = Minimal depression, 5-9 = Mild depression, 10-14 = Moderate depression, 15-19 = Moderately severe depression, 20-27 = Severe depression   Psychosocial Evaluation and Intervention:  Psychosocial Evaluation - 04/08/20 0833      Psychosocial Evaluation & Interventions   Interventions Encouraged to exercise with the program and follow exercise prescription    Comments He can look to his wife, daugher and family for support. He has a positive outlook on his health.    Expected Outcomes Short: Exercise regularly to support mental health and notify staff of any changes. Long: maintain mental health and well being through teaching of rehab or prescribed medications independently.    Continue Psychosocial Services  Follow up required by staff           Psychosocial Re-Evaluation:   Psychosocial Discharge (Final Psychosocial Re-Evaluation):   Vocational Rehabilitation: Provide vocational rehab assistance to qualifying candidates.   Vocational Rehab Evaluation & Intervention:   Education: Education Goals: Education classes will be provided on a variety of topics geared toward better understanding of heart health and risk factor modification. Participant will state understanding/return demonstration of topics presented as noted by education test scores.  Learning Barriers/Preferences:  Learning Barriers/Preferences - 04/08/20 0831      Learning Barriers/Preferences   Learning Barriers None  Learning Preferences None           General Cardiac Education Topics:  AED/CPR: - Group verbal and written instruction with the use of models to demonstrate the basic use of the AED with the basic ABC's of resuscitation.   Anatomy & Physiology of the Heart: - Group verbal and written instruction  and models provide basic cardiac anatomy and physiology, with the coronary electrical and arterial systems. Review of Valvular disease and Heart Failure   Cardiac Procedures: - Group verbal and written instruction to review commonly prescribed medications for heart disease. Reviews the medication, class of the drug, and side effects. Includes the steps to properly store meds and maintain the prescription regimen. (beta blockers and nitrates)   Cardiac Medications I: - Group verbal and written instruction to review commonly prescribed medications for heart disease. Reviews the medication, class of the drug, and side effects. Includes the steps to properly store meds and maintain the prescription regimen.   Cardiac Medications II: -Group verbal and written instruction to review commonly prescribed medications for heart disease. Reviews the medication, class of the drug, and side effects. (all other drug classes)   Cardiac Rehab from 04/23/2020 in [facility] Cardiac and Pulmonary Rehab  Date 04/16/20  Educator SB  Instruction Review Code 1- Verbalizes Understanding       Go Sex-Intimacy & Heart Disease, Get SMART - Goal Setting: - Group verbal and written instruction through game format to discuss heart disease and the return to sexual intimacy. Provides group verbal and written material to discuss and apply goal setting through the application of the S.M.A.R.T. Method.   Other Matters of the Heart: - Provides group verbal, written materials and models to describe Stable Angina and Peripheral Artery. Includes description of the disease process and treatment options available to the cardiac patient.   Infection Prevention: - Provides verbal and written material to individual with discussion of infection control including proper hand washing and proper equipment cleaning during exercise session.   Cardiac Rehab from 04/23/2020 in [facility] Cardiac and Pulmonary Rehab  Date 04/08/20  Educator [facility]   Instruction Review Code 1- Verbalizes Understanding      Falls Prevention: - Provides verbal and written material to individual with discussion of falls prevention and safety.   Cardiac Rehab from 04/23/2020 in [facility] Cardiac and Pulmonary Rehab  Date 04/08/20  Educator [facility]  Instruction Review Code 1- Verbalizes Understanding      Other: -Provides group and verbal instruction on various topics (see comments)   Knowledge Questionnaire Score:  Knowledge Questionnaire Score - 04/10/20 1622      Knowledge Questionnaire Score   Pre Score 21/26: Heart rate, heart failure, nutrition, exercise           Core Components/Risk Factors/Patient Goals at Admission:  Personal Goals and Risk Factors at Admission - 04/10/20 1637      Core Components/Risk Factors/Patient Goals on Admission    Weight Management Yes;Weight Loss    Intervention Weight Management: Develop a combined nutrition and exercise program designed to reach desired caloric intake, while maintaining appropriate intake of nutrient and fiber, sodium and fats, and appropriate energy expenditure required for the weight goal.;Weight Management: Provide education and appropriate resources to help participant work on and attain dietary goals.;Weight Management/Obesity: Establish reasonable short term and long term weight goals.    Admit Weight 199 lb (90.3 kg)    Goal Weight: Short Term 194 lb (88 kg)    Goal Weight: Long Term 189 lb (85.7  kg)    Expected Outcomes Short Term: Continue to assess and modify interventions until short term weight is achieved;Long Term: Adherence to nutrition and physical activity/exercise program aimed toward attainment of established weight goal;Weight Loss: Understanding of general recommendations for a balanced deficit meal plan, which promotes 1-2 lb weight loss per week and includes a negative energy balance of [phone] kcal/d;Understanding recommendations for meals to include 15-35% energy as protein,  25-35% energy from fat, 35-60% energy from carbohydrates, less than 294m of dietary cholesterol, 20-35 gm of total fiber daily;Understanding of distribution of calorie intake throughout the day with the consumption of 4-5 meals/snacks    Hypertension Yes    Intervention Monitor prescription use compliance.;Provide education on lifestyle modifcations including regular physical activity/exercise, weight management, moderate sodium restriction and increased consumption of fresh fruit, vegetables, and low fat dairy, alcohol moderation, and smoking cessation.    Expected Outcomes Short Term: Continued assessment and intervention until BP is < 140/926mHG in hypertensive participants. < 130/8072mG in hypertensive participants with diabetes, heart failure or chronic kidney disease.;Long Term: Maintenance of blood pressure at goal levels.    Lipids Yes    Intervention Provide education and support for participant on nutrition & aerobic/resistive exercise along with prescribed medications to achieve LDL <74m69mDL >40mg92m Expected Outcomes Short Term: Participant states understanding of desired cholesterol values and is compliant with medications prescribed. Participant is following exercise prescription and nutrition guidelines.;Long Term: Cholesterol controlled with medications as prescribed, with individualized exercise RX and with personalized nutrition plan. Value goals: LDL < 74mg,63m > 40 mg.           Education:Diabetes - Individual verbal and written instruction to review signs/symptoms of diabetes, desired ranges of glucose level fasting, after meals and with exercise. Acknowledge that pre and post exercise glucose checks will be done for 3 sessions at entry of program.   Education: Know Your Numbers and Risk Factors: -Group verbal and written instruction about important numbers in your health.  Discussion of what are risk factors and how they play a role in the disease process.  Review of  Cholesterol, Blood Pressure, Diabetes, and BMI and the role they play in your overall health.   [facility]  Date 04/16/20  Educator SB  Instruction Review Code 1- Verbalizes Understanding      Core Components/Risk Factors/Patient Goals Review:    Core Components/Risk Factors/Patient Goals at Discharge (Final Review):    ITP Comments:  ITP Comments    Row Name 04/08/20 0855 04/10/20 1855 04/14/20 1359 04/30/20 0516     ITP Comments Virtual Visit completed. Patient informed on EP and RD appointment and 6 Minute walk test. Patient also informed of patient health questionnaires on My Chart. Patient Verbalizes understanding. Visit diagnosis can be found in [facility] 6[facility]. Completed 6MWT and gym orientation. Initial ITP created and sent for review to Dr. Mark MEmily Filbertcal Director. First full day of exercise!  Patient was oriented to gym and equipment including functions, settings, policies, and procedures.  Patient's individual exercise prescription and treatment plan were reviewed.  All starting workloads were established based on the results of the 6 minute walk test done at initial orientation visit.  The plan for exercise progression was also introduced and progression will be customized based on patient's performance and goals. 30 Day review completed. Medical Director ITP review done, changes made as directed, and signed approval by Medical Director.  Comments:

## 2020-05-05 ENCOUNTER — Other Ambulatory Visit: Payer: Self-pay

## 2020-05-05 ENCOUNTER — Encounter: Payer: Medicare Other | Admitting: *Deleted

## 2020-05-05 DIAGNOSIS — Z955 Presence of coronary angioplasty implant and graft: Secondary | ICD-10-CM

## 2020-05-05 NOTE — Progress Notes (Signed)
Daily Session Note  Patient Details  Name: Richard Webster MRN: [id number] Date of Birth: [birth year] Referring Provider:     Cardiac Rehab from 04/10/2020 in [facility] Cardiac and Pulmonary Rehab  Referring Provider Dr. Lujean Amel      Encounter Date: 05/05/2020  Check In:  Session Check In - 05/05/20 1407      Check-In   Supervising physician immediately available to respond to emergencies See telemetry face sheet for immediately available ER MD    Location [facility]-Cardiac & Pulmonary Rehab    Staff Present Renita Papa, RN Margurite Auerbach, MS Exercise Physiologist;Kelly Amedeo Plenty, BS, ACSM CEP, Exercise Physiologist    Virtual Visit No    Medication changes reported     No    Fall or balance concerns reported    No    Warm-up and Cool-down Performed on first and last piece of equipment    Resistance Training Performed Yes    VAD Patient? No    PAD/SET Patient? No      Pain Assessment   Currently in Pain? No/denies              Social History   Tobacco Use  Smoking Status Former Smoker  . Packs/day: 0.50  . Years: 15.00  . Pack years: 7.50  . Types: Cigarettes  . Quit date: 09/04/1983  . Years since quitting: 36.6  Smokeless Tobacco Former Environmental health practitioner Met:  Independence with exercise equipment Exercise tolerated well No report of cardiac concerns or symptoms Strength training completed today  Goals Unmet:  Not Applicable  Comments: Pt able to follow exercise prescription today without complaint.  Will continue to monitor for progression.    Dr. Emily Filbert is Medical Director for [place] and [facility].

## 2020-05-07 ENCOUNTER — Other Ambulatory Visit: Payer: Self-pay

## 2020-05-07 ENCOUNTER — Encounter: Payer: Medicare Other | Admitting: *Deleted

## 2020-05-07 DIAGNOSIS — Z955 Presence of coronary angioplasty implant and graft: Secondary | ICD-10-CM

## 2020-05-07 NOTE — Progress Notes (Signed)
Daily Session Note  Patient Details  Name: Richard Webster MRN: [id number] Date of Birth: 1956-07-24 Referring Provider:     Cardiac Rehab from 04/10/2020 in [facility] Cardiac and Pulmonary Rehab  Referring Provider Dr. Lujean Amel      Encounter Date: 05/07/2020  Check In:  Session Check In - 05/07/20 1422      Check-In   Supervising physician immediately available to respond to emergencies See telemetry face sheet for immediately available ER MD    Location [facility]-Cardiac & Pulmonary Rehab    Staff Present Renita Papa, RN BSN;Joseph Lou Miner, [place] Exercise Physiologist;Amanda Oletta Darter, [place], ACSM CEP, Exercise Physiologist    Virtual Visit No    Medication changes reported     No    Fall or balance concerns reported    No    Warm-up and Cool-down Performed on first and last piece of equipment    Resistance Training Performed Yes    VAD Patient? No    PAD/SET Patient? No      Pain Assessment   Currently in Pain? No/denies              Social History   Tobacco Use  Smoking Status Former Smoker  . Packs/day: 0.50  . Years: 15.00  . Pack years: 7.50  . Types: Cigarettes  . Quit date: 09/04/1983  . Years since quitting: 36.6  Smokeless Tobacco Former Environmental health practitioner Met:  Independence with exercise equipment Exercise tolerated well No report of cardiac concerns or symptoms Strength training completed today  Goals Unmet:  Not Applicable  Comments: Pt able to follow exercise prescription today without complaint.  Will continue to monitor for progression.    Dr. Emily Filbert is Medical Director for [place] and [facility].

## 2020-05-12 ENCOUNTER — Encounter: Payer: Medicare Other | Admitting: *Deleted

## 2020-05-12 ENCOUNTER — Other Ambulatory Visit: Payer: Self-pay

## 2020-05-12 DIAGNOSIS — Z955 Presence of coronary angioplasty implant and graft: Secondary | ICD-10-CM | POA: Diagnosis not present

## 2020-05-12 NOTE — Progress Notes (Signed)
Daily Session Note  Patient Details  Name: Richard Webster MRN: [id number] Date of Birth: 16-May-1956 Referring Provider:     Cardiac Rehab from 04/10/2020 in [facility] Cardiac and Pulmonary Rehab  Referring Provider Dr. Lujean Webster      Encounter Date: 05/12/2020  Check In:  Session Check In - 05/12/20 1406      Check-In   Supervising physician immediately available to respond to emergencies See telemetry face sheet for immediately available ER MD    Location [facility]-Cardiac & Pulmonary Rehab    Staff Present Richard Papa, RN Richard Auerbach, MS Exercise Physiologist;Richard Webster, BS, ACSM CEP, Exercise Physiologist   Richard Sons RN   Virtual Visit No    Medication changes reported     No    Fall or balance concerns reported    No    Warm-up and Cool-down Performed on first and last piece of equipment    Resistance Training Performed Yes    VAD Patient? No    PAD/SET Patient? No      Pain Assessment   Currently in Pain? No/denies              Social History   Tobacco Use  Smoking Status Former Smoker  . Packs/day: 0.50  . Years: 15.00  . Pack years: 7.50  . Types: Cigarettes  . Quit date: 09/04/1983  . Years since quitting: 36.7  Smokeless Tobacco Former User   Reviewed home exercise with pt today.  Pt plans to walk for exercise. 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 Talked about starting off with smaller bouts of exercise (~10 minutes at a time) 3x/ day and slowly increasing the duration up to the complete 30 minutes of aerobic exercise at once. Will start off with adding 1 extra day/ week and increasing that thereafter. He also stated he is thinking about purchasing equipment to keep at his house. Reviewed THR, pulse, RPE, sign and symptoms, pulse oximetery and when to call 911 or MD.  Also discussed weather considerations and indoor options.  Pt voiced understanding.   Goals Met:    Independence with exercise equipment Exercise tolerated well No report of cardiac concerns or symptoms Strength training completed today  Goals Unmet:  Not Applicable  Comments: Pt able to follow exercise prescription today without complaint.  Will continue to monitor for progression.    Dr. Emily Webster is Medical Director for [facility].

## 2020-05-14 ENCOUNTER — Other Ambulatory Visit: Payer: Self-pay

## 2020-05-14 DIAGNOSIS — Z955 Presence of coronary angioplasty implant and graft: Secondary | ICD-10-CM | POA: Diagnosis not present

## 2020-05-14 NOTE — Progress Notes (Signed)
Daily Session Note  Patient Details  Name: Richard Webster MRN: [id number] Date of Birth: [birth year] Referring Provider:     Cardiac Rehab from 04/10/2020 in [facility] Cardiac and Pulmonary Rehab  Referring Provider Dr. Lujean Amel      Encounter Date: 05/14/2020  Check In:  Session Check In - 05/14/20 1452      Check-In   Supervising physician immediately available to respond to emergencies See telemetry face sheet for immediately available ER MD    Location [facility]-Cardiac & Pulmonary Rehab    Staff Present Renita Papa, RN BSN;Joseph Lou Miner, [place] Exercise Physiologist;Revin Corker Rosalia Hammers, MPA, RN    Virtual Visit No    Medication changes reported     No    Fall or balance concerns reported    No    Warm-up and Cool-down Performed on first and last piece of equipment    Resistance Training Performed Yes    VAD Patient? No    PAD/SET Patient? No      Pain Assessment   Currently in Pain? No/denies              Social History   Tobacco Use  Smoking Status Former Smoker  . Packs/day: 0.50  . Years: 15.00  . Pack years: 7.50  . Types: Cigarettes  . Quit date: 09/04/1983  . Years since quitting: 36.7  Smokeless Tobacco Former Systems developer    Goals Met:  Independence with exercise equipment Exercise tolerated well No report of cardiac concerns or symptoms Strength training completed today  Goals Unmet:  Not Applicable  Comments: Pt able to follow exercise prescription today without complaint.  Will continue to monitor for progression.   Dr. Emily Filbert is Medical Director for [place] and [facility].

## 2020-05-19 ENCOUNTER — Other Ambulatory Visit: Payer: Self-pay

## 2020-05-19 DIAGNOSIS — Z955 Presence of coronary angioplasty implant and graft: Secondary | ICD-10-CM | POA: Diagnosis not present

## 2020-05-19 NOTE — Progress Notes (Signed)
Daily Session Note  Patient Details  Name: GIBSON LAD MRN: [id number] Date of Birth: 1955-08-30 Referring Provider:     Cardiac Rehab from 04/10/2020 in [facility] Cardiac and Pulmonary Rehab  Referring Provider Dr. Lujean Amel      Encounter Date: 05/19/2020  Check In:  Session Check In - 05/19/20 1423      Check-In   Supervising physician immediately available to respond to emergencies See telemetry face sheet for immediately available ER MD    Location [facility]-Cardiac & Pulmonary Rehab    Staff Present Birdie Sons, MPA, Mauricia Area, BS, ACSM CEP, Exercise Physiologist;Kara Eliezer Bottom, MS Exercise Physiologist;Meredith Sherryll Burger, RN BSN    Virtual Visit No    Medication changes reported     No    Fall or balance concerns reported    No    Warm-up and Cool-down Performed on first and last piece of equipment    Resistance Training Performed Yes    VAD Patient? No    PAD/SET Patient? No      Pain Assessment   Currently in Pain? No/denies              Social History   Tobacco Use  Smoking Status Former Smoker  . Packs/day: 0.50  . Years: 15.00  . Pack years: 7.50  . Types: Cigarettes  . Quit date: 09/04/1983  . Years since quitting: 36.7  Smokeless Tobacco Former Systems developer    Goals Met:  Independence with exercise equipment Exercise tolerated well No report of cardiac concerns or symptoms Strength training completed today  Goals Unmet:  Not Applicable  Comments: Pt able to follow exercise prescription today without complaint.  Will continue to monitor for progression.   Dr. Emily Filbert is Medical Director for [place] and [facility].

## 2020-05-21 ENCOUNTER — Other Ambulatory Visit: Payer: Self-pay

## 2020-05-21 DIAGNOSIS — Z955 Presence of coronary angioplasty implant and graft: Secondary | ICD-10-CM | POA: Diagnosis not present

## 2020-05-21 NOTE — Progress Notes (Signed)
Daily Session Note  Patient Details  Name: KALLUM JORGENSEN MRN: [id number] Date of Birth: 03-Dec-1955 Referring Provider:     Cardiac Rehab from 04/10/2020 in [facility] Cardiac and Pulmonary Rehab  Referring Provider Dr. Lujean Amel      Encounter Date: 05/21/2020  Check In:  Session Check In - 05/21/20 1448      Check-In   Supervising physician immediately available to respond to emergencies See telemetry face sheet for immediately available ER MD    Location [facility]-Cardiac & Pulmonary Rehab    Staff Present Birdie Sons, MPA, Nino Glow, MS Exercise Physiologist;Joseph Flavia Shipper    Virtual Visit No    Medication changes reported     No    Fall or balance concerns reported    No    Warm-up and Cool-down Performed on first and last piece of equipment    Resistance Training Performed Yes    VAD Patient? No    PAD/SET Patient? No      Pain Assessment   Currently in Pain? No/denies              Social History   Tobacco Use  Smoking Status Former Smoker  . Packs/day: 0.50  . Years: 15.00  . Pack years: 7.50  . Types: Cigarettes  . Quit date: 09/04/1983  . Years since quitting: 36.7  Smokeless Tobacco Former Systems developer    Goals Met:  Independence with exercise equipment Exercise tolerated well No report of cardiac concerns or symptoms Strength training completed today  Goals Unmet:  Not Applicable  Comments: Pt able to follow exercise prescription today without complaint.  Will continue to monitor for progression.   Dr. Emily Filbert is Medical Director for [place] and [facility].

## 2020-05-26 ENCOUNTER — Other Ambulatory Visit: Payer: Self-pay

## 2020-05-26 ENCOUNTER — Encounter: Payer: Medicare Other | Attending: Internal Medicine

## 2020-05-26 DIAGNOSIS — Z955 Presence of coronary angioplasty implant and graft: Secondary | ICD-10-CM | POA: Diagnosis present

## 2020-05-26 NOTE — Progress Notes (Signed)
Daily Session Note  Patient Details  Name: Richard Webster MRN: [id number] Date of Birth: [birth year] Referring Provider:     Cardiac Rehab from 04/10/2020 in [facility] Cardiac and Pulmonary Rehab  Referring Provider Dr. Lujean Amel      Encounter Date: 05/26/2020  Check In:  Session Check In - 05/26/20 1407      Check-In   Supervising physician immediately available to respond to emergencies See telemetry face sheet for immediately available ER MD    Location [facility]-Cardiac & Pulmonary Rehab    Staff Present Birdie Sons, MPA, Mauricia Area, BS, ACSM CEP, Exercise Physiologist;Kara Eliezer Bottom, MS Exercise Physiologist    Virtual Visit No    Medication changes reported     No    Fall or balance concerns reported    No    Warm-up and Cool-down Performed on first and last piece of equipment    Resistance Training Performed Yes    VAD Patient? No    PAD/SET Patient? No      Pain Assessment   Currently in Pain? No/denies              Social History   Tobacco Use  Smoking Status Former Smoker  . Packs/day: 0.50  . Years: 15.00  . Pack years: 7.50  . Types: Cigarettes  . Quit date: 09/04/1983  . Years since quitting: 36.7  Smokeless Tobacco Former Systems developer    Goals Met:  Independence with exercise equipment Exercise tolerated well No report of cardiac concerns or symptoms Strength training completed today  Goals Unmet:  Not Applicable  Comments: Pt able to follow exercise prescription today without complaint.  Will continue to monitor for progression.    Dr. Emily Filbert is Medical Director for [place] and [facility].

## 2020-05-28 ENCOUNTER — Encounter: Payer: Self-pay | Admitting: *Deleted

## 2020-05-28 ENCOUNTER — Other Ambulatory Visit: Payer: Self-pay

## 2020-05-28 DIAGNOSIS — Z955 Presence of coronary angioplasty implant and graft: Secondary | ICD-10-CM

## 2020-05-28 NOTE — Progress Notes (Signed)
Daily Session Note  Patient Details  Name: Richard Webster MRN: [id number] Date of Birth: 11/30/1955 Referring Provider:     Cardiac Rehab from 04/10/2020 in [facility] Cardiac and Pulmonary Rehab  Referring Provider Dr. Lujean Amel      Encounter Date: 05/28/2020  Check In:  Session Check In - 05/28/20 1356      Check-In   Supervising physician immediately available to respond to emergencies See telemetry face sheet for immediately available ER MD    Location [facility]-Cardiac & Pulmonary Rehab    Staff Present Birdie Sons, MPA, RN;Joseph Lou Miner, MS Exercise Physiologist    Virtual Visit No    Medication changes reported     No    Fall or balance concerns reported    No    Warm-up and Cool-down Performed on first and last piece of equipment    Resistance Training Performed Yes    VAD Patient? No    PAD/SET Patient? No      Pain Assessment   Currently in Pain? No/denies              Social History   Tobacco Use  Smoking Status Former Smoker   Packs/day: 0.50   Years: 15.00   Pack years: 7.50   Types: Cigarettes   Quit date: 09/04/1983   Years since quitting: 36.7  Smokeless Tobacco Former Systems developer    Goals Met:  Independence with exercise equipment Exercise tolerated well No report of cardiac concerns or symptoms Strength training completed today  Goals Unmet:  Not Applicable  Comments: Pt able to follow exercise prescription today without complaint.  Will continue to monitor for progression.    Dr. Emily Filbert is Medical Director for [place] and [facility].

## 2020-05-28 NOTE — Progress Notes (Signed)
Cardiac Individual Treatment Plan  Patient Details  Name: MARCELLO TUZZOLINO MRN: [id number] Date of Birth: 03/30/1956 Referring Provider:     Cardiac Rehab from 04/10/2020 in [facility] Cardiac and Pulmonary Rehab  Referring Provider Dr. Lujean Amel      Initial Encounter Date:    Cardiac Rehab from 04/10/2020 in [facility] Cardiac and Pulmonary Rehab  Date 04/10/20      Visit Diagnosis: Status post coronary artery stent placement  Patient's Home Medications on Admission:  Current Outpatient Medications:  .  aspirin EC 81 MG tablet, Take 81 mg by mouth daily., Disp: , Rfl:  .  clopidogrel (PLAVIX) 75 MG tablet, Take by mouth., Disp: , Rfl:  .  etodolac (LODINE) 400 MG tablet, Take 400 mg by mouth daily., Disp: , Rfl:  .  naproxen sodium (ALEVE) 220 MG tablet, Take 220 mg by mouth daily as needed (pain). (Patient not taking: Reported on 04/08/2020), Disp: , Rfl:  .  nitroGLYCERIN (NITROSTAT) 0.4 MG SL tablet, Place under the tongue., Disp: , Rfl:  .  olmesartan (BENICAR) 20 MG tablet, Take by mouth., Disp: , Rfl:  .  olmesartan-hydrochlorothiazide (BENICAR HCT) 20-12.5 MG tablet, Take 1 tablet by mouth daily., Disp: , Rfl:  .  omeprazole (PRILOSEC) 40 MG capsule, Take 40 mg by mouth daily before breakfast., Disp: , Rfl:  .  pantoprazole (PROTONIX) 20 MG tablet, Take 1 tablet by mouth daily., Disp: , Rfl:  .  rosuvastatin (CRESTOR) 20 MG tablet, Take by mouth., Disp: , Rfl:   Past Medical History: Past Medical History:  Diagnosis Date  . Anxiety    pt denies  . Arthritis   . Atypical chest pain   . BPH (benign prostatic hyperplasia)   . Chronic kidney disease    kidney stones  . H/O rheumatic heart disease   . Heart murmur   . History of right bundle branch block (RBBB)   . Hypertension   . Palpitations   . Prostatitis   . Rheumatic fever     Tobacco Use: Social History   Tobacco Use  Smoking Status Former Smoker  . Packs/day: 0.50  . Years: 15.00  . Pack years: 7.50  .  Types: Cigarettes  . Quit date: 09/04/1983  . Years since quitting: 36.7  Smokeless Tobacco Former Geophysical data processor: Recent Chemical engineer   There is no flowsheet data to display.      Exercise Target Goals: Exercise Program Goal: Individual exercise prescription set using results from initial 6 min walk test and THRR while considering  patient's activity barriers and safety.   Exercise Prescription Goal: Initial exercise prescription builds to 30-45 minutes a day of aerobic activity, 2-3 days per week.  Home exercise guidelines will be given to patient during program as part of exercise prescription that the participant will acknowledge.   Education: Aerobic Exercise & Resistance Training: - Gives group verbal and written instruction on the various components of exercise. Focuses on aerobic and resistive training programs and the benefits of this training and how to safely progress through these programs..   Cardiac Rehab from 05/21/2020 in [facility] Cardiac and Pulmonary Rehab  Date 05/21/20  Hilda Blades only 10/27]  Educator [facility]  Instruction Review Code 1- [employer] Understanding      Education: Exercise & Equipment Safety: - Individual verbal instruction and demonstration of equipment use and safety with use of the equipment.   Cardiac Rehab from 05/21/2020 in [facility] Cardiac and Pulmonary Rehab  Date 04/08/20  Educator [facility]  Instruction Review Code 1- Verbalizes Understanding      Education: Exercise Physiology & General Exercise Guidelines: - Group verbal and written instruction with models to review the exercise physiology of the cardiovascular system and associated critical values. Provides general exercise guidelines with specific guidelines to those with heart or lung disease.    Cardiac Rehab from 05/21/2020 in [facility] Cardiac and Pulmonary Rehab  Date 05/07/20  Educator [facility]  Instruction Review Code 1- Verbalizes Understanding      Education: Flexibility, Balance,  Mind/Body Relaxation: Provides group verbal/written instruction on the benefits of flexibility and balance training, including mind/body exercise modes such as yoga, pilates and tai chi.  Demonstration and skill practice provided.   Activity Barriers & Risk Stratification:  Activity Barriers & Cardiac Risk Stratification - 04/10/20 1638      Activity Barriers & Cardiac Risk Stratification   Activity Barriers Arthritis   Arthritis left foot   Cardiac Risk Stratification Moderate           6 Minute Walk:  6 Minute Walk    Row Name 04/10/20 1626         6 Minute Walk   Phase Initial     Distance 1472 feet     Walk Time 6 minutes     # of Rest Breaks 0     MPH 2.78     METS 3.25     RPE 9     Perceived Dyspnea  0     VO2 Peak 11.4     Symptoms No     Resting HR 58 bpm     Resting BP 118/70     Resting Oxygen Saturation  97 %     Exercise Oxygen Saturation  during 6 min walk 98 %     Max Ex. HR 85 bpm     Max Ex. BP 142/74     2 Minute Post BP 120/68            Oxygen Initial Assessment:   Oxygen Re-Evaluation:   Oxygen Discharge (Final Oxygen Re-Evaluation):   Initial Exercise Prescription:  Initial Exercise Prescription - 04/10/20 1600      Date of Initial Exercise RX and Referring Provider   Date 04/10/20    Referring Provider Dr. Lujean Amel      Treadmill   MPH 2.5    Grade 0.5    Minutes 15    METs 3.09      NuStep   Level 3    SPM 80    Minutes 15    METs 3.2      REL-XR   Level 2    Speed 50    Minutes 15    METs 3.2      T5 Nustep   Level 2    SPM 80    Minutes 15    METs 3.2      Biostep-RELP   Level 2    SPM 50    Minutes 15    METs 3.2      Prescription Details   Frequency (times per week) 2    Duration Progress to 30 minutes of continuous aerobic without signs/symptoms of physical distress      Intensity   THRR 40-80% of Max Heartrate 96-135    Ratings of Perceived Exertion 11-13    Perceived Dyspnea 0-4        Progression   Progression Continue to progress workloads to maintain intensity without signs/symptoms of  physical distress.      Resistance Training   Training Prescription Yes    Weight 3 lb    Reps 10-15           Perform Capillary Blood Glucose checks as needed.  Exercise Prescription Changes:  Exercise Prescription Changes    Row Name 04/10/20 1600 04/16/20 1300 04/30/20 0900 05/12/20 1500 05/13/20 0900     Response to Exercise   Blood Pressure (Admit) 118/70 138/72 118/70 -- 138/80   Blood Pressure (Exercise) 142/74 158/76 142/70 -- 196/84   Blood Pressure (Exit) 120/68 130/70 122/68 -- 136/76   Heart Rate (Admit) 58 bpm 57 bpm 77 bpm -- 85 bpm   Heart Rate (Exercise) 85 bpm 100 bpm 125 bpm -- 113 bpm   Heart Rate (Exit) 63 bpm 71 bpm 86 bpm -- 85 bpm   Oxygen Saturation (Admit) 97 % -- -- -- --   Oxygen Saturation (Exercise) 98 % -- -- -- --   Oxygen Saturation (Exit) 99 % -- -- -- --   Rating of Perceived Exertion (Exercise) 9 12 15  -- 12   Perceived Dyspnea (Exercise) 0 -- -- -- --   Symptoms none none none -- none   Comments walk test results -- -- -- --   Duration Progress to 30 minutes of  aerobic without signs/symptoms of physical distress -- Continue with 30 min of aerobic exercise without signs/symptoms of physical distress. -- Continue with 30 min of aerobic exercise without signs/symptoms of physical distress.   Intensity -- -- THRR unchanged -- THRR unchanged     Progression   Progression -- -- Continue to progress workloads to maintain intensity without signs/symptoms of physical distress. -- Continue to progress workloads to maintain intensity without signs/symptoms of physical distress.   Average METs -- -- 2.82 -- 3     Resistance Training   Training Prescription -- -- Yes -- Yes   Weight -- -- 4 lb -- 4 lb   Reps -- -- 10-15 -- 10-15     Interval Training   Interval Training -- -- No -- --     Treadmill   MPH -- -- 2.5 -- 3   Grade -- -- 0.5  -- 1   Minutes -- -- 15 -- 15   METs -- -- 3.09 -- 3.7     NuStep   Level -- -- 3 -- --   Minutes -- -- 15 -- --   METs -- -- 3 -- --     Elliptical   Level -- -- 1 -- --   Speed -- -- 2.5 -- --   Minutes -- -- 15 -- --     REL-XR   Level -- -- 3 -- 3   Speed -- -- -- -- 50   Minutes -- -- 15 -- 15   METs -- -- 2.2 -- --     Biostep-RELP   Level -- -- 3 -- --   Minutes -- -- 15 -- --   METs -- -- 3 -- --     Home Exercise Plan   Plans to continue exercise at -- -- -- Home (comment)  walking --   Frequency -- -- -- Add 2 additional days to program exercise sessions.  Start with adding 1 day/week, then increase to 2 --   Initial Home Exercises Provided -- -- -- 05/12/20 --          Exercise Comments:   Exercise Goals and Review:  Exercise Goals  [place] Name 04/10/20 1636             Exercise Goals   Increase Physical Activity Yes       Intervention Provide advice, education, support and counseling about physical activity/exercise needs.;Develop an individualized exercise prescription for aerobic and resistive training based on initial evaluation findings, risk stratification, comorbidities and participant's personal goals.       Expected Outcomes Short Term: Attend rehab on a regular basis to increase amount of physical activity.;Long Term: Add in home exercise to make exercise part of routine and to increase amount of physical activity.;Long Term: Exercising regularly at least 3-5 days a week.       Increase Strength and Stamina Yes       Intervention Provide advice, education, support and counseling about physical activity/exercise needs.;Develop an individualized exercise prescription for aerobic and resistive training based on initial evaluation findings, risk stratification, comorbidities and participant's personal goals.       Expected Outcomes Short Term: Increase workloads from initial exercise prescription for resistance, speed, and METs.;Short Term: Perform  resistance training exercises routinely during rehab and add in resistance training at home;Long Term: Improve cardiorespiratory fitness, muscular endurance and strength as measured by increased METs and functional capacity (6MWT)       Able to understand and use rate of perceived exertion (RPE) scale Yes       Intervention Provide education and explanation on how to use RPE scale       Expected Outcomes Short Term: Able to use RPE daily in rehab to express subjective intensity level;Long Term:  Able to use RPE to guide intensity level when exercising independently       Able to understand and use Dyspnea scale Yes       Intervention Provide education and explanation on how to use Dyspnea scale       Expected Outcomes Short Term: Able to use Dyspnea scale daily in rehab to express subjective sense of shortness of breath during exertion;Long Term: Able to use Dyspnea scale to guide intensity level when exercising independently       Knowledge and understanding of Target Heart Rate Range (THRR) Yes       Intervention Provide education and explanation of THRR including how the numbers were predicted and where they are located for reference       Expected Outcomes Short Term: Able to state/look up THRR;Short Term: Able to use daily as guideline for intensity in rehab;Long Term: Able to use THRR to govern intensity when exercising independently       Able to check pulse independently Yes       Intervention Provide education and demonstration on how to check pulse in carotid and radial arteries.;Review the importance of being able to check your own pulse for safety during independent exercise       Expected Outcomes Short Term: Able to explain why pulse checking is important during independent exercise;Long Term: Able to check pulse independently and accurately       Understanding of Exercise Prescription Yes       Intervention Provide education, explanation, and written materials on patient's individual  exercise prescription       Expected Outcomes Short Term: Able to explain program exercise prescription;Long Term: Able to explain home exercise prescription to exercise independently              Exercise Goals Re-Evaluation :  Exercise Goals Re-Evaluation    Row Name 04/14/20 1400 04/30/20 0931 05/12/20 1514  05/13/20 0908       Exercise Goal Re-Evaluation   Exercise Goals Review Increase Physical Activity;Able to understand and use rate of perceived exertion (RPE) scale;Knowledge and understanding of Target Heart Rate Range (THRR);Understanding of Exercise Prescription;Increase Strength and Stamina;Able to check pulse independently Increase Physical Activity;Increase Strength and Stamina;Understanding of Exercise Prescription Increase Physical Activity;Increase Strength and Stamina;Understanding of Exercise Prescription Increase Physical Activity;Increase Strength and Stamina;Understanding of Exercise Prescription    Comments Reviewed PLB technique with pt.  Talked about how it works and it's importance in maintaining their exercise saturations. [name] is off to a good start in rehab.  He has already increased his handweights to 4 lb.  We will continue to monitor his progress. Reviewed home exercise with pt today.  Pt plans to walk for exercise. 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 Talked about starting off with smaller bouts of exercise (~10 minutes at a time) 3x/ day and slowly increasing the duration up to the complete 30 minutes of aerobic exercise at once. Will start off with adding 1 extra day/ week and increasing that thereafter. He also stated he is thinking about purchasing equipment to keep at his house. Reviewed THR, pulse, RPE, sign and symptoms, pulse oximetery and when to call 911 or MD.  Also discussed weather considerations and indoor options.  Pt voiced understanding. [name] is making steady progress with  exercise.  Staff will monitor progress.    Expected Outcomes Short: Become more profiecient at using PLB.   Long: Become independent at using PLB. Short: Increase NuStep and add incline to treadmill  Long: Continue to improve stamina. Short: Start adding in 1 day of exercise at home Long: Able to exercise independently at home with no complications Short: continue to exercise consistently Long:  increase overal MET level           Discharge Exercise Prescription (Final Exercise Prescription Changes):  Exercise Prescription Changes - 05/13/20 0900      Response to Exercise   Blood Pressure (Admit) 138/80    Blood Pressure (Exercise) 196/84    Blood Pressure (Exit) 136/76    Heart Rate (Admit) 85 bpm    Heart Rate (Exercise) 113 bpm    Heart Rate (Exit) 85 bpm    Rating of Perceived Exertion (Exercise) 12    Symptoms none    Duration Continue with 30 min of aerobic exercise without signs/symptoms of physical distress.    Intensity THRR unchanged      Progression   Progression Continue to progress workloads to maintain intensity without signs/symptoms of physical distress.    Average METs 3      Resistance Training   Training Prescription Yes    Weight 4 lb    Reps 10-15      Treadmill   MPH 3    Grade 1    Minutes 15    METs 3.7      REL-XR   Level 3    Speed 50    Minutes 15           Nutrition:  Target Goals: Understanding of nutrition guidelines, daily intake of sodium <1535m, cholesterol <206m calories 30% from fat and 7% or less from saturated fats, daily to have 5 or more servings of fruits and vegetables.  Education: Controlling Sodium/Reading Food Labels -Group verbal and written material supporting the discussion of sodium use in heart  healthy nutrition. Review and explanation with models, verbal and written materials for utilization of the food label.   Education: General Nutrition Guidelines/Fats and Fiber: -Group instruction provided by verbal,  written material, models and posters to present the general guidelines for heart healthy nutrition. Gives an explanation and review of dietary fats and fiber.   Biometrics:  Pre Biometrics - 04/10/20 1636      Pre Biometrics   Height 5' 8.2" (1.732 m)    Weight 199 lb (90.3 kg)    BMI (Calculated) 30.09    Single Leg Stand 30 seconds            Nutrition Therapy Plan and Nutrition Goals:  Nutrition Therapy & Goals - 04/23/20 1333      Nutrition Therapy   Diet Heart healthy, Low Na    Drug/Food Interactions Statins/Certain Fruits    Protein (specify units) 75g    Fiber 30 grams    Whole Grain Foods 3 servings    Saturated Fats 12 max. grams    Fruits and Vegetables 5 servings/day    Sodium 1.5 grams      Personal Nutrition Goals   Nutrition Goal ST: increase vegetable intake and increase variety LT: Get stronger, feels like he is getting weaker and weaker. 7/10 right now    Comments Activity level has decreases. He feels like he is having muscle soreness. He will have a biscuit from [employer] for breakfast on the road. B: cheerios with banana and almond milk, restaurant breakfast, protein shake. L: meat with starch and vegetables D: same as lunch. Eats mostly red meat but will sometimes have chicken, fish and prok chops. Reports the vegetables he normally eats are potatoes and corn. Discussed variety and My[employer]. Discuseed heart healhty eating. Coffee with sweetener - asked about artificial sweeteners - he has GI distress with them, discussed how a moderate amount of sugar for his coffee can fit into a heart healthy diet - he does not like stevia.      Intervention Plan   Intervention Prescribe, educate and counsel regarding individualized specific dietary modifications aiming towards targeted core components such as weight, hypertension, lipid management, diabetes, heart failure and other comorbidities.;Nutrition handout(s) given to patient.    Expected Outcomes Short Term Goal:  Understand basic principles of dietary content, such as calories, fat, sodium, cholesterol and nutrients.;Short Term Goal: A plan has been developed with personal nutrition goals set during dietitian appointment.;Long Term Goal: Adherence to prescribed nutrition plan.           Nutrition Assessments:  Nutrition Assessments - 04/10/20 1621      MEDFICTS Scores   Pre Score 79           MEDIFICTS Score Key:          ?70 Need to make dietary changes          40-70 Heart Healthy Diet         ? 40 Therapeutic Level Cholesterol Diet  Nutrition Goals Re-Evaluation:  Nutrition Goals Re-Evaluation    [place] Name 05/14/20 1429             Goals   Nutrition Goal ST: Continue increase vegetable intake and increase variety, replace red meat 2x/week with fish (shrimp and salmon or tuna and beans) LT: Get stronger, feels like he is getting weaker and weaker. 7/10 right now       Comment Tim reports have added squash, zucchini, and carrots to dinner meal. Discussed Replacing red meat with  another more heart healthy protein - gave some ideas for salmon as pt reports not knowing how to cook that. continue to add variety to vegetables.       Expected Outcome ST: Continue increase vegetable intake and increase variety, replace red meat 2x/week with fish (shrimp and salmon or tuna and beans) [id number] red meat to 1-2x/week, eat out <3x/week, get a variety of fruits and vegetables.              Nutrition Goals Discharge (Final Nutrition Goals Re-Evaluation):  Nutrition Goals Re-Evaluation - 05/14/20 1429      Goals   Nutrition Goal ST: Continue increase vegetable intake and increase variety, replace red meat 2x/week with fish (shrimp and salmon or tuna and beans) LT: Get stronger, feels like he is getting weaker and weaker. 7/10 right now    Comment Tim reports have added squash, zucchini, and carrots to dinner meal. Discussed Replacing red meat with another more heart healthy protein - gave some  ideas for salmon as pt reports not knowing how to cook that. continue to add variety to vegetables.    Expected Outcome ST: Continue increase vegetable intake and increase variety, replace red meat 2x/week with fish (shrimp and salmon or tuna and beans) [id number] red meat to 1-2x/week, eat out <3x/week, get a variety of fruits and vegetables.           Psychosocial: Target Goals: Acknowledge presence or absence of significant depression and/or stress, maximize coping skills, provide positive support system. Participant is able to verbalize types and ability to use techniques and skills needed for reducing stress and depression.   Education: Depression - Provides group verbal and written instruction on the correlation between heart/lung disease and depressed mood, treatment options, and the stigmas associated with seeking treatment.   Cardiac Rehab from 05/21/2020 in [facility] Cardiac and Pulmonary Rehab  Date 04/30/20  Educator [facility]  Instruction Review Code 1- [employer] Understanding      Education: Sleep Hygiene -Provides group verbal and written instruction about how sleep can affect your health.  Define sleep hygiene, discuss sleep cycles and impact of sleep habits. Review good sleep hygiene tips.     Education: Stress and Anxiety: - Provides group verbal and written instruction about the health risks of elevated stress and causes of high stress.  Discuss the correlation between heart/lung disease and anxiety and treatment options. Review healthy ways to manage with stress and anxiety.   Cardiac Rehab from 05/21/2020 in [facility] Cardiac and Pulmonary Rehab  Date 04/30/20  Educator [facility]  Instruction Review Code 1- Verbalizes Understanding       Initial Review & Psychosocial Screening:  Initial Psych Review & Screening - 04/08/20 0832      Initial Review   Current issues with None Identified      Family Dynamics   Good Support System? Yes    Comments He can look to his wife, daugher  and family for support. He has a positive outlook on his health.      Barriers   Psychosocial barriers to participate in program There are no identifiable barriers or psychosocial needs.      Screening Interventions   Interventions Encouraged to exercise;Provide feedback about the scores to participant;To provide support and resources with identified psychosocial needs    Expected Outcomes Short Term goal: Utilizing psychosocial counselor, staff and physician to assist with identification of specific Stressors or current issues interfering with healing process. Setting desired goal for each stressor or current issue  identified.;Long Term Goal: Stressors or current issues are controlled or eliminated.;Short Term goal: Identification and review with participant of any Quality of Life or Depression concerns found by scoring the questionnaire.;Long Term goal: The participant improves quality of Life and PHQ9 Scores as seen by post scores and/or verbalization of changes           Quality of Life Scores:   Quality of Life - 04/10/20 1625      Quality of Life   Select Quality of Life      Quality of Life Scores   Health/Function Pre 21.77 %    Socioeconomic Pre 24.56 %    Psych/Spiritual Pre 23.07 %    Family Pre 30 %    GLOBAL Pre 23.84 %          Scores of 19 and below usually indicate a poorer quality of life in these areas.  A difference of  2-3 points is a clinically meaningful difference.  A difference of 2-3 points in the total score of the Quality of Life Index has been associated with significant improvement in overall quality of life, self-image, physical symptoms, and general health in studies assessing change in quality of life.  PHQ-9: Recent Review Flowsheet Data    Depression screen [facility] 2/9 04/10/2020   Decreased Interest 1   Down, Depressed, Hopeless 1   PHQ - 2 Score 2   Altered sleeping 0   Tired, decreased energy 0   Change in appetite 0   Feeling bad or failure  about yourself  0   Trouble concentrating 0   Moving slowly or fidgety/restless 0   Suicidal thoughts 0   PHQ-9 Score 2   Difficult doing work/chores Not difficult at all     Interpretation of Total Score  Total Score Depression Severity:  1-4 = Minimal depression, 5-9 = Mild depression, 10-14 = Moderate depression, 15-19 = Moderately severe depression, 20-27 = Severe depression   Psychosocial Evaluation and Intervention:  Psychosocial Evaluation - 04/08/20 0833      Psychosocial Evaluation & Interventions   Interventions Encouraged to exercise with the program and follow exercise prescription    Comments He can look to his wife, daugher and family for support. He has a positive outlook on his health.    Expected Outcomes Short: Exercise regularly to support mental health and notify staff of any changes. Long: maintain mental health and well being through teaching of rehab or prescribed medications independently.    Continue Psychosocial Services  Follow up required by staff           Psychosocial Re-Evaluation:  Psychosocial Re-Evaluation    [place] Name 05/14/20 1435 05/14/20 1523 05/14/20 1526         Psychosocial Re-Evaluation   Current issues with -- Current Stress Concerns --     Comments -- [name] is doing really well mentally. He states that he sleeps well at night. 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 He is due for his physical for truck driving in December and is really aiming to have stable blood pressures by then. He inquired about using meditation or breathing exercises to help lower his blood pressure as he sometimes feels anxious when he goes in for his physical test. Discussed using [email or web address] for breathing and meditation and patient is willing and excited to try it. --     Expected Outcomes -- Short: Practice using [email or web address] exercises  Long: Continue to maintain positive attitude --     Interventions -- Encouraged  to attend Cardiac Rehabilitation for the exercise --     Continue Psychosocial Services  -- Follow up required by staff --            Psychosocial Discharge (Final Psychosocial Re-Evaluation):  Psychosocial Re-Evaluation - 05/14/20 1526      Psychosocial Re-Evaluation   Interventions --    Continue Psychosocial Services  --           Vocational Rehabilitation: Provide vocational rehab assistance to qualifying candidates.   Vocational Rehab Evaluation & Intervention:   Education: Education Goals: Education classes will be provided on a variety of topics geared toward better understanding of heart health and risk factor modification. Participant will state understanding/return demonstration of topics presented as noted by education test scores.  Learning Barriers/Preferences:  Learning Barriers/Preferences - 04/08/20 0831      Learning Barriers/Preferences   Learning Barriers None    Learning Preferences None           General Cardiac Education Topics:  AED/CPR: - Group verbal and written instruction with the use of models to demonstrate the basic use of the AED with the basic ABC's of resuscitation.   Anatomy & Physiology of the Heart: - Group verbal and written instruction and models provide basic cardiac anatomy and physiology, with the coronary electrical and arterial systems. Review of Valvular disease and Heart Failure   Cardiac Rehab from 05/21/2020 in [facility] Cardiac and Pulmonary Rehab  Date 05/21/20  Educator [facility]  Instruction Review Code 1- Verbalizes Understanding      Cardiac Procedures: - Group verbal and written instruction to review commonly prescribed medications for heart disease. Reviews the medication, class of the drug, and side effects. Includes the steps to properly store meds and maintain the prescription regimen. (beta blockers and nitrates)   Cardiac Rehab from 05/21/2020 in [facility] Cardiac and Pulmonary Rehab  Date 05/21/20  Educator [facility]   Instruction Review Code 1- Verbalizes Understanding      Cardiac Medications I: - Group verbal and written instruction to review commonly prescribed medications for heart disease. Reviews the medication, class of the drug, and side effects. Includes the steps to properly store meds and maintain the prescription regimen.   Cardiac Medications II: -Group verbal and written instruction to review commonly prescribed medications for heart disease. Reviews the medication, class of the drug, and side effects. (all other drug classes)   Cardiac Rehab from 05/21/2020 in [facility] Cardiac and Pulmonary Rehab  Date 04/16/20  Educator SB  Instruction Review Code 1- Verbalizes Understanding       Go Sex-Intimacy & Heart Disease, Get SMART - Goal Setting: - Group verbal and written instruction through game format to discuss heart disease and the return to sexual intimacy. Provides group verbal and written material to discuss and apply goal setting through the application of the S.M.A.R.T. Method.   Cardiac Rehab from 05/21/2020 in [facility] Cardiac and Pulmonary Rehab  Date 05/21/20  Educator [facility]  Instruction Review Code 1- Verbalizes Understanding      Other Matters of the Heart: - Provides group verbal, written materials and models to describe Stable Angina and Peripheral Artery. Includes description of the disease process and treatment options available to the cardiac patient.   Infection Prevention: - Provides verbal and written material to individual with discussion of infection control including proper hand washing and proper equipment cleaning during exercise session.   Cardiac Rehab from 05/21/2020  in [facility] Cardiac and Pulmonary Rehab  Date 04/08/20  Educator [facility]  Instruction Review Code 1- Verbalizes Understanding      Falls Prevention: - Provides verbal and written material to individual with discussion of falls prevention and safety.   Cardiac Rehab from 05/21/2020 in [facility] Cardiac and  Pulmonary Rehab  Date 04/08/20  Educator [facility]  Instruction Review Code 1- Verbalizes Understanding      Other: -Provides group and verbal instruction on various topics (see comments)   Knowledge Questionnaire Score:  Knowledge Questionnaire Score - 04/10/20 1622      Knowledge Questionnaire Score   Pre Score 21/26: Heart rate, heart failure, nutrition, exercise           Core Components/Risk Factors/Patient Goals at Admission:  Personal Goals and Risk Factors at Admission - 04/10/20 1637      Core Components/Risk Factors/Patient Goals on Admission    Weight Management Yes;Weight Loss    Intervention Weight Management: Develop a combined nutrition and exercise program designed to reach desired caloric intake, while maintaining appropriate intake of nutrient and fiber, sodium and fats, and appropriate energy expenditure required for the weight goal.;Weight Management: Provide education and appropriate resources to help participant work on and attain dietary goals.;Weight Management/Obesity: Establish reasonable short term and long term weight goals.    Admit Weight 199 lb (90.3 kg)    Goal Weight: Short Term 194 lb (88 kg)    Goal Weight: Long Term 189 lb (85.7 kg)    Expected Outcomes Short Term: Continue to assess and modify interventions until short term weight is achieved;Long Term: Adherence to nutrition and physical activity/exercise program aimed toward attainment of established weight goal;Weight Loss: Understanding of general recommendations for a balanced deficit meal plan, which promotes 1-2 lb weight loss per week and includes a negative energy balance of [phone] kcal/d;Understanding recommendations for meals to include 15-35% energy as protein, 25-35% energy from fat, 35-60% energy from carbohydrates, less than 241m of dietary cholesterol, 20-35 gm of total fiber daily;Understanding of distribution of calorie intake throughout the day with the consumption of 4-5 meals/snacks     Hypertension Yes    Intervention Monitor prescription use compliance.;Provide education on lifestyle modifcations including regular physical activity/exercise, weight management, moderate sodium restriction and increased consumption of fresh fruit, vegetables, and low fat dairy, alcohol moderation, and smoking cessation.    Expected Outcomes Short Term: Continued assessment and intervention until BP is < 140/950mHG in hypertensive participants. < 130/8034mG in hypertensive participants with diabetes, heart failure or chronic kidney disease.;Long Term: Maintenance of blood pressure at goal levels.    Lipids Yes    Intervention Provide education and support for participant on nutrition & aerobic/resistive exercise along with prescribed medications to achieve LDL <3m57mDL >40mg19m Expected Outcomes Short Term: Participant states understanding of desired cholesterol values and is compliant with medications prescribed. Participant is following exercise prescription and nutrition guidelines.;Long Term: Cholesterol controlled with medications as prescribed, with individualized exercise RX and with personalized nutrition plan. Value goals: LDL < 3mg,77m > 40 mg.           Education:Diabetes - Individual verbal and written instruction to review signs/symptoms of diabetes, desired ranges of glucose level fasting, after meals and with exercise. Acknowledge that pre and post exercise glucose checks will be done for 3 sessions at entry of program.   Education: Know Your Numbers and Risk Factors: -Group verbal and written instruction about important numbers in your health.  Discussion  of what are risk factors and how they play a role in the disease process.  Review of Cholesterol, Blood Pressure, Diabetes, and BMI and the role they play in your overall health.   Cardiac Rehab from 05/21/2020 in [facility] Cardiac and Pulmonary Rehab  Date 04/16/20  Educator SB  Instruction Review Code 1- Verbalizes  Understanding      Core Components/Risk Factors/Patient Goals Review:   Goals and Risk Factor Review    Row Name 05/14/20 1518             Core Components/Risk Factors/Patient Goals Review   Personal Goals Review Weight Management/Obesity;Hypertension;Lipids       Review [name] is doing well. He continues to weigh himself at home and reports his weight remains stable within 1-2 pounds. He would like to lose weight, and is really focusing on eatier healthier. He recently spoke with the RD today to set new nutrition goals. He also plans to exercise more at home to also help. Has been checking his BP at home and stays around 811B systolic and 14N diastolic. 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 Talked about keeping a log of his blood pressures to monitor closely. Remains compliance with medications.       Expected Outcomes Short: Keep consisent log of BPs Long: Continue to manage lifestyle risk factors              Core Components/Risk Factors/Patient Goals at Discharge (Final Review):   Goals and Risk Factor Review - 05/14/20 1518      Core Components/Risk Factors/Patient Goals Review   Personal Goals Review Weight Management/Obesity;Hypertension;Lipids    Review [name] is doing well. He continues to weigh himself at home and reports his weight remains stable within 1-2 pounds. He would like to lose weight, and is really focusing on eatier healthier. He recently spoke with the RD today to set new nutrition goals. He also plans to exercise more at home to also help. Has been checking his BP at home and stays around 829F systolic and 62Z diastolic. 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 Talked about keeping a log of his blood pressures to monitor closely. Remains compliance with medications.    Expected Outcomes Short: Keep consisent log of BPs Long: Continue to manage lifestyle risk factors            ITP Comments:  ITP Comments    Row Name 04/08/20 0855 04/10/20 1855 04/14/20 1359 04/30/20 0516 05/28/20 0609   ITP Comments Virtual Visit completed. Patient informed on EP and RD appointment and 6 Minute walk test. Patient also informed of patient health questionnaires on My Chart. Patient Verbalizes understanding. Visit diagnosis can be found in [facility] 12/27/2019. Completed 6MWT and gym orientation. Initial ITP created and sent for review to Dr. Emily Filbert, Medical Director. First full day of exercise!  Patient was oriented to gym and equipment including functions, settings, policies, and procedures.  Patient's individual exercise prescription and treatment plan were reviewed.  All starting workloads were established based on the results of the 6 minute walk test done at initial orientation visit.  The plan for exercise progression was also introduced and progression will be customized based on patient's performance and goals. 30 Day review completed. Medical Director ITP review done, changes made as directed, and signed approval by Medical Director. 30 Day review completed. Medical Director ITP review done, changes made  as directed, and signed approval by Market researcher.          Comments:

## 2020-06-02 ENCOUNTER — Other Ambulatory Visit: Payer: Self-pay

## 2020-06-02 DIAGNOSIS — Z955 Presence of coronary angioplasty implant and graft: Secondary | ICD-10-CM | POA: Diagnosis not present

## 2020-06-02 NOTE — Progress Notes (Signed)
Daily Session Note  Patient Details  Name: Richard Webster MRN: [id number] Date of Birth: [birth year] Referring Provider:     Cardiac Rehab from 04/10/2020 in [facility] Cardiac and Pulmonary Rehab  Referring Provider Dr. Lujean Amel      Encounter Date: 06/02/2020  Check In:  Session Check In - 06/02/20 1402      Check-In   Supervising physician immediately available to respond to emergencies See telemetry face sheet for immediately available ER MD    Location [facility]-Cardiac & Pulmonary Rehab    Staff Present Birdie Sons, MPA, Mauricia Area, BS, ACSM CEP, Exercise Physiologist;Kara Eliezer Bottom, MS Exercise Physiologist    Virtual Visit No    Medication changes reported     No    Fall or balance concerns reported    No    Warm-up and Cool-down Performed on first and last piece of equipment    Resistance Training Performed Yes    VAD Patient? No    PAD/SET Patient? No      Pain Assessment   Currently in Pain? No/denies              Social History   Tobacco Use  Smoking Status Former Smoker  . Packs/day: 0.50  . Years: 15.00  . Pack years: 7.50  . Types: Cigarettes  . Quit date: 09/04/1983  . Years since quitting: 36.7  Smokeless Tobacco Former Systems developer    Goals Met:  Independence with exercise equipment Exercise tolerated well No report of cardiac concerns or symptoms Strength training completed today  Goals Unmet:  Not Applicable  Comments: Pt able to follow exercise prescription today without complaint.  Will continue to monitor for progression.    Dr. Emily Filbert is Medical Director for [place] and [facility].

## 2020-06-04 ENCOUNTER — Other Ambulatory Visit: Payer: Self-pay

## 2020-06-04 DIAGNOSIS — Z955 Presence of coronary angioplasty implant and graft: Secondary | ICD-10-CM

## 2020-06-04 NOTE — Progress Notes (Signed)
Daily Session Note  Patient Details  Name: TEKOA AMON MRN: [id number] Date of Birth: Dec 21, 1955 Referring Provider:     Cardiac Rehab from 04/10/2020 in [facility] Cardiac and Pulmonary Rehab  Referring Provider Dr. Lujean Amel      Encounter Date: 06/04/2020  Check In:  Session Check In - 06/04/20 1411      Check-In   Supervising physician immediately available to respond to emergencies See telemetry face sheet for immediately available ER MD    Location [facility]-Cardiac & Pulmonary Rehab    Staff Present Birdie Sons, MPA, Elveria Rising, BA, ACSM CEP, Exercise Physiologist;Meredith Sherryll Burger, RN Margurite Auerbach, MS Exercise Physiologist    Virtual Visit No    Medication changes reported     No    Fall or balance concerns reported    No    Warm-up and Cool-down Performed on first and last piece of equipment    Resistance Training Performed Yes    VAD Patient? No    PAD/SET Patient? No      Pain Assessment   Currently in Pain? No/denies              Social History   Tobacco Use  Smoking Status Former Smoker  . Packs/day: 0.50  . Years: 15.00  . Pack years: 7.50  . Types: Cigarettes  . Quit date: 09/04/1983  . Years since quitting: 36.7  Smokeless Tobacco Former Systems developer    Goals Met:  Independence with exercise equipment Exercise tolerated well No report of cardiac concerns or symptoms Strength training completed today  Goals Unmet:  Not Applicable  Comments: Pt able to follow exercise prescription today without complaint.  Will continue to monitor for progression.    Dr. Emily Filbert is Medical Director for [place] and [facility].

## 2020-06-09 ENCOUNTER — Other Ambulatory Visit: Payer: Self-pay

## 2020-06-09 DIAGNOSIS — Z955 Presence of coronary angioplasty implant and graft: Secondary | ICD-10-CM | POA: Diagnosis not present

## 2020-06-09 NOTE — Progress Notes (Signed)
Daily Session Note  Patient Details  Name: Richard Webster MRN: [id number] Date of Birth: [birth year] Referring Provider:     Cardiac Rehab from 04/10/2020 in [facility] Cardiac and Pulmonary Rehab  Referring Provider Dr. Lujean Amel      Encounter Date: 06/09/2020  Check In:  Session Check In - 06/09/20 1359      Check-In   Supervising physician immediately available to respond to emergencies See telemetry face sheet for immediately available ER MD    Location [facility]-Cardiac & Pulmonary Rehab    Staff Present Birdie Sons, MPA, Mauricia Area, BS, ACSM CEP, Exercise Physiologist;Kara Eliezer Bottom, MS Exercise Physiologist    Virtual Visit No    Medication changes reported     No    Fall or balance concerns reported    No    Warm-up and Cool-down Performed on first and last piece of equipment    Resistance Training Performed Yes    VAD Patient? No    PAD/SET Patient? No      Pain Assessment   Currently in Pain? No/denies              Social History   Tobacco Use  Smoking Status Former Smoker  . Packs/day: 0.50  . Years: 15.00  . Pack years: 7.50  . Types: Cigarettes  . Quit date: 09/04/1983  . Years since quitting: 36.7  Smokeless Tobacco Former Systems developer    Goals Met:  Independence with exercise equipment Exercise tolerated well No report of cardiac concerns or symptoms Strength training completed today  Goals Unmet:  Not Applicable  Comments: Pt able to follow exercise prescription today without complaint.  Will continue to monitor for progression.    Dr. Emily Filbert is Medical Director for [place] and [facility].

## 2020-06-11 ENCOUNTER — Other Ambulatory Visit: Payer: Self-pay

## 2020-06-11 VITALS — Ht 68.2 in | Wt 199.2 lb

## 2020-06-11 DIAGNOSIS — Z955 Presence of coronary angioplasty implant and graft: Secondary | ICD-10-CM

## 2020-06-11 NOTE — Progress Notes (Signed)
Daily Session Note  Patient Details  Name: Richard Webster MRN: [id number] Date of Birth: Dec 28, 1955 Referring Provider:     Cardiac Rehab from 04/10/2020 in [facility] Cardiac and Pulmonary Rehab  Referring Provider Dr. Lujean Amel      Encounter Date: 06/11/2020  Check In:  Session Check In - 06/11/20 1409      Check-In   Supervising physician immediately available to respond to emergencies See telemetry face sheet for immediately available ER MD    Location [facility]-Cardiac & Pulmonary Rehab    Staff Present Birdie Sons, MPA, RN;Jessica Luan Pulling, MA, RCEP, CCRP, Marylynn Pearson, MS Exercise Physiologist;Joseph Hood RCP,RRT,BSRT    Virtual Visit No    Medication changes reported     No    Fall or balance concerns reported    No    Warm-up and Cool-down Performed on first and last piece of equipment    Resistance Training Performed Yes    VAD Patient? No    PAD/SET Patient? No      Pain Assessment   Currently in Pain? No/denies              Social History   Tobacco Use  Smoking Status Former Smoker  . Packs/day: 0.50  . Years: 15.00  . Pack years: 7.50  . Types: Cigarettes  . Quit date: 09/04/1983  . Years since quitting: 36.7  Smokeless Tobacco Former Systems developer    Goals Met:  Independence with exercise equipment Exercise tolerated well No report of cardiac concerns or symptoms Strength training completed today  Goals Unmet:  Not Applicable  Comments: Pt able to follow exercise prescription today without complaint.  Will continue to monitor for progression.   [place] Name 04/10/20 1626 06/11/20 1427       6 Minute Walk   Phase Initial Discharge    Distance 1472 feet 1580 feet    Distance % Change -- 7.3 %    Distance Feet Change -- 108 ft    Walk Time 6 minutes 6 minutes    # of Rest Breaks 0 0    MPH 2.78 2.99    METS 3.25 3.82    RPE 9 13    Perceived Dyspnea  0 0    VO2 Peak 11.4 13.36    Symptoms No No    Resting HR 58 bpm 72 bpm     Resting BP 118/70 126/62    Resting Oxygen Saturation  97 % 97 %    Exercise Oxygen Saturation  during 6 min walk 98 % 98 %    Max Ex. HR 85 bpm 104 bpm    Max Ex. BP 142/74 164/64    2 Minute Post BP 120/68 --            Dr. Emily Filbert is Medical Director for [place] and [facility].

## 2020-06-16 ENCOUNTER — Other Ambulatory Visit: Payer: Self-pay

## 2020-06-16 DIAGNOSIS — Z955 Presence of coronary angioplasty implant and graft: Secondary | ICD-10-CM

## 2020-06-16 NOTE — Progress Notes (Signed)
Daily Session Note  Patient Details  Name: Richard Webster MRN: [id number] Date of Birth: November 10, 1955 Referring Provider:     Cardiac Rehab from 04/10/2020 in [facility] Cardiac and Pulmonary Rehab  Referring Provider Dr. Lujean Amel      Encounter Date: 06/16/2020  Check In:  Session Check In - 06/16/20 1412      Check-In   Supervising physician immediately available to respond to emergencies See telemetry face sheet for immediately available ER MD    Location [facility]-Cardiac & Pulmonary Rehab    Staff Present Birdie Sons, MPA, Mauricia Area, BS, ACSM CEP, Exercise Physiologist;Kara Eliezer Bottom, MS Exercise Physiologist    Virtual Visit No    Medication changes reported     No    Fall or balance concerns reported    No    Warm-up and Cool-down Performed on first and last piece of equipment    Resistance Training Performed Yes    VAD Patient? No    PAD/SET Patient? No      Pain Assessment   Currently in Pain? No/denies              Social History   Tobacco Use  Smoking Status Former Smoker  . Packs/day: 0.50  . Years: 15.00  . Pack years: 7.50  . Types: Cigarettes  . Quit date: 09/04/1983  . Years since quitting: 36.8  Smokeless Tobacco Former Systems developer    Goals Met:  Independence with exercise equipment Exercise tolerated well No report of cardiac concerns or symptoms Strength training completed today  Goals Unmet:  Not Applicable  Comments: Pt able to follow exercise prescription today without complaint.  Will continue to monitor for progression.    Dr. Emily Filbert is Medical Director for [place] and [facility].

## 2020-06-18 ENCOUNTER — Encounter: Payer: Medicare Other | Admitting: *Deleted

## 2020-06-18 ENCOUNTER — Other Ambulatory Visit: Payer: Self-pay

## 2020-06-18 DIAGNOSIS — Z955 Presence of coronary angioplasty implant and graft: Secondary | ICD-10-CM

## 2020-06-18 NOTE — Progress Notes (Signed)
Daily Session Note  Patient Details  Name: Richard Webster MRN: [id number] Date of Birth: 03/03/1956 Referring Provider:     Cardiac Rehab from 04/10/2020 in [facility] Cardiac and Pulmonary Rehab  Referring Provider Dr. Lujean Amel      Encounter Date: 06/18/2020  Check In:  Session Check In - 06/18/20 1456      Check-In   Supervising physician immediately available to respond to emergencies See telemetry face sheet for immediately available ER MD    Location [facility]-Cardiac & Pulmonary Rehab    Staff Present Birdie Sons, MPA, RN;Meredith Sherryll Burger, RN Margurite Auerbach, MS Exercise Physiologist;Maximiliano Cromartie, RN, BSN, CCRP    Virtual Visit No    Medication changes reported     No    Fall or balance concerns reported    No    Warm-up and Cool-down Performed on first and last piece of equipment    Resistance Training Performed Yes    VAD Patient? No    PAD/SET Patient? No      Pain Assessment   Currently in Pain? No/denies              Social History   Tobacco Use  Smoking Status Former Smoker  . Packs/day: 0.50  . Years: 15.00  . Pack years: 7.50  . Types: Cigarettes  . Quit date: 09/04/1983  . Years since quitting: 36.8  Smokeless Tobacco Former Systems developer    Goals Met:  Independence with exercise equipment Exercise tolerated well No report of cardiac concerns or symptoms  Goals Unmet:  Not Applicable  Comments: Pt able to follow exercise prescription today without complaint.  Will continue to monitor for progression.    Dr. Emily Filbert is Medical Director for [place] and [facility].

## 2020-06-25 ENCOUNTER — Encounter: Payer: Self-pay | Admitting: *Deleted

## 2020-06-25 ENCOUNTER — Other Ambulatory Visit: Payer: Self-pay

## 2020-06-25 ENCOUNTER — Encounter: Payer: Medicare Other | Attending: Internal Medicine

## 2020-06-25 DIAGNOSIS — Z955 Presence of coronary angioplasty implant and graft: Secondary | ICD-10-CM | POA: Diagnosis present

## 2020-06-25 NOTE — Progress Notes (Signed)
Daily Session Note  Patient Details  Name: Richard Webster MRN: [id number] Date of Birth: 1956/06/17 Referring Provider:     Cardiac Rehab from 04/10/2020 in [facility]  Referring Provider Dr. Lujean Amel      Encounter Date: 06/25/2020  Check In:  Session Check In - 06/25/20 1440      Check-In   Supervising physician immediately available to respond to emergencies See telemetry face sheet for immediately available ER MD    Location [facility]-Cardiac & Pulmonary Rehab    Staff Present Birdie Sons, MPA, Elveria Rising, BA, ACSM CEP, Exercise Physiologist;Kara Eliezer Bottom, MS Exercise Physiologist    Virtual Visit No    Medication changes reported     No    Fall or balance concerns reported    No    Warm-up and Cool-down Performed on first and last piece of equipment    Resistance Training Performed Yes    VAD Patient? No    PAD/SET Patient? No      Pain Assessment   Currently in Pain? No/denies              Social History   Tobacco Use  Smoking Status Former Smoker  . Packs/day: 0.50  . Years: 15.00  . Pack years: 7.50  . Types: Cigarettes  . Quit date: 09/04/1983  . Years since quitting: 36.8  Smokeless Tobacco Former Systems developer    Goals Met:  Independence with exercise equipment Exercise tolerated well No report of cardiac concerns or symptoms Strength training completed today  Goals Unmet:  Not Applicable  Comments: Pt able to follow exercise prescription today without complaint.  Will continue to monitor for progression.    Dr. Emily Filbert is Medical Director for [facility].

## 2020-06-25 NOTE — Progress Notes (Signed)
Cardiac Individual Treatment Plan  Patient Details  Name: DOHN STCLAIR MRN: [id number] Date of Birth: [birth year] Referring Provider:     Cardiac Rehab from 04/10/2020 in [facility] Cardiac and Pulmonary Rehab  Referring Provider Dr. Lujean Amel      Initial Encounter Date:    Cardiac Rehab from 04/10/2020 in [facility] Cardiac and Pulmonary Rehab  Date 04/10/20      Visit Diagnosis: Status post coronary artery stent placement  Patient's Home Medications on Admission:  Current Outpatient Medications:  .  aspirin EC 81 MG tablet, Take 81 mg by mouth daily., Disp: , Rfl:  .  clopidogrel (PLAVIX) 75 MG tablet, Take by mouth., Disp: , Rfl:  .  etodolac (LODINE) 400 MG tablet, Take 400 mg by mouth daily., Disp: , Rfl:  .  naproxen sodium (ALEVE) 220 MG tablet, Take 220 mg by mouth daily as needed (pain). (Patient not taking: Reported on 04/08/2020), Disp: , Rfl:  .  nitroGLYCERIN (NITROSTAT) 0.4 MG SL tablet, Place under the tongue., Disp: , Rfl:  .  olmesartan (BENICAR) 20 MG tablet, Take by mouth., Disp: , Rfl:  .  olmesartan-hydrochlorothiazide (BENICAR HCT) 20-12.5 MG tablet, Take 1 tablet by mouth daily., Disp: , Rfl:  .  omeprazole (PRILOSEC) 40 MG capsule, Take 40 mg by mouth daily before breakfast., Disp: , Rfl:  .  pantoprazole (PROTONIX) 20 MG tablet, Take 1 tablet by mouth daily., Disp: , Rfl:  .  rosuvastatin (CRESTOR) 20 MG tablet, Take by mouth., Disp: , Rfl:   Past Medical History: Past Medical History:  Diagnosis Date  . Anxiety    pt denies  . Arthritis   . Atypical chest pain   . BPH (benign prostatic hyperplasia)   . Chronic kidney disease    kidney stones  . H/O rheumatic heart disease   . Heart murmur   . History of right bundle branch block (RBBB)   . Hypertension   . Palpitations   . Prostatitis   . Rheumatic fever     Tobacco Use: Social History   Tobacco Use  Smoking Status Former Smoker  . Packs/day: 0.50  . Years: 15.00  . Pack years: 7.50  .  Types: Cigarettes  . Quit date: 09/04/1983  . Years since quitting: 36.8  Smokeless Tobacco Former Geophysical data processor: Recent Chemical engineer   There is no flowsheet data to display.      Exercise Target Goals: Exercise Program Goal: Individual exercise prescription set using results from initial 6 min walk test and THRR while considering  patient's activity barriers and safety.   Exercise Prescription Goal: Initial exercise prescription builds to 30-45 minutes a day of aerobic activity, 2-3 days per week.  Home exercise guidelines will be given to patient during program as part of exercise prescription that the participant will acknowledge.   Education: Aerobic Exercise: - Group verbal and visual presentation on the components of exercise prescription. Introduces F.I.T.T principle from ACSM for exercise prescriptions.  Reviews F.I.T.T. principles of aerobic exercise including progression. Written material given at graduation.   Cardiac Rehab from 06/18/2020 in [facility] Cardiac and Pulmonary Rehab  Date 05/21/20  Hilda Blades only 10/27]  Educator [facility]  Instruction Review Code 1- [employer] Understanding      Education: Resistance Exercise: - Group verbal and visual presentation on the components of exercise prescription. Introduces F.I.T.T principle from ACSM for exercise prescriptions  Reviews F.I.T.T. principles of resistance exercise including progression. Written material given at graduation.  Education: Exercise & Equipment Safety: - Individual verbal instruction and demonstration of equipment use and safety with use of the equipment.   Cardiac Rehab from 06/18/2020 in [facility] Cardiac and Pulmonary Rehab  Date 04/08/20  Educator [facility]  Instruction Review Code 1- Verbalizes Understanding      Education: Exercise Physiology & General Exercise Guidelines: - Group verbal and written instruction with models to review the exercise physiology of the cardiovascular system and associated  critical values. Provides general exercise guidelines with specific guidelines to those with heart or lung disease.    Cardiac Rehab from 06/18/2020 in [facility] Cardiac and Pulmonary Rehab  Date 05/07/20  Educator AS  Instruction Review Code 1- Verbalizes Understanding      Education: Flexibility, Balance, Mind/Body Relaxation: - Group verbal and visual presentation with interactive activity on the components of exercise prescription. Introduces F.I.T.T principle from ACSM for exercise prescriptions. Reviews F.I.T.T. principles of flexibility and balance exercise training including progression. Also discusses the mind body connection.  Reviews various relaxation techniques to help reduce and manage stress (i.e. Deep breathing, progressive muscle relaxation, and visualization). Balance handout provided to take home. Written material given at graduation.   Cardiac Rehab from 06/18/2020 in [facility] Cardiac and Pulmonary Rehab  Date 05/28/20  Educator AS  Instruction Review Code 1- Verbalizes Understanding      Activity Barriers & Risk Stratification:  Activity Barriers & Cardiac Risk Stratification - 04/10/20 1638      Activity Barriers & Cardiac Risk Stratification   Activity Barriers Arthritis   Arthritis left foot   Cardiac Risk Stratification Moderate           6 Minute Walk:  6 Minute Walk    Row Name 04/10/20 1626 06/11/20 1427       6 Minute Walk   Phase Initial Discharge    Distance 1472 feet 1580 feet    Distance % Change -- 7.3 %    Distance Feet Change -- 108 ft    Walk Time 6 minutes 6 minutes    # of Rest Breaks 0 0    MPH 2.78 2.99    METS 3.25 3.82    RPE 9 13    Perceived Dyspnea  0 0    VO2 Peak 11.4 13.36    Symptoms No No    Resting HR 58 bpm 72 bpm    Resting BP 118/70 126/62    Resting Oxygen Saturation  97 % 97 %    Exercise Oxygen Saturation  during 6 min walk 98 % 98 %    Max Ex. HR 85 bpm 104 bpm    Max Ex. BP 142/74 164/64    2 Minute Post BP 120/68  --           Oxygen Initial Assessment:   Oxygen Re-Evaluation:   Oxygen Discharge (Final Oxygen Re-Evaluation):   Initial Exercise Prescription:  Initial Exercise Prescription - 04/10/20 1600      Date of Initial Exercise RX and Referring Provider   Date 04/10/20    Referring Provider Dr. Lujean Amel      Treadmill   MPH 2.5    Grade 0.5    Minutes 15    METs 3.09      NuStep   Level 3    SPM 80    Minutes 15    METs 3.2      REL-XR   Level 2    Speed 50    Minutes 15    METs  3.2      T5 Nustep   Level 2    SPM 80    Minutes 15    METs 3.2      Biostep-RELP   Level 2    SPM 50    Minutes 15    METs 3.2      Prescription Details   Frequency (times per week) 2    Duration Progress to 30 minutes of continuous aerobic without signs/symptoms of physical distress      Intensity   THRR 40-80% of Max Heartrate 96-135    Ratings of Perceived Exertion 11-13    Perceived Dyspnea 0-4      Progression   Progression Continue to progress workloads to maintain intensity without signs/symptoms of physical distress.      Resistance Training   Training Prescription Yes    Weight 3 lb    Reps 10-15           Perform Capillary Blood Glucose checks as needed.  Exercise Prescription Changes:  Exercise Prescription Changes    Row Name 04/10/20 1600 04/16/20 1300 04/30/20 0900 05/12/20 1500 05/13/20 0900     Response to Exercise   Blood Pressure (Admit) 118/70 138/72 118/70 -- 138/80   Blood Pressure (Exercise) 142/74 158/76 142/70 -- 196/84   Blood Pressure (Exit) 120/68 130/70 122/68 -- 136/76   Heart Rate (Admit) 58 bpm 57 bpm 77 bpm -- 85 bpm   Heart Rate (Exercise) 85 bpm 100 bpm 125 bpm -- 113 bpm   Heart Rate (Exit) 63 bpm 71 bpm 86 bpm -- 85 bpm   Oxygen Saturation (Admit) 97 % -- -- -- --   Oxygen Saturation (Exercise) 98 % -- -- -- --   Oxygen Saturation (Exit) 99 % -- -- -- --   Rating of Perceived Exertion (Exercise) 9 12 15  -- 12    Perceived Dyspnea (Exercise) 0 -- -- -- --   Symptoms none none none -- none   Comments walk test results -- -- -- --   Duration Progress to 30 minutes of  aerobic without signs/symptoms of physical distress -- Continue with 30 min of aerobic exercise without signs/symptoms of physical distress. -- Continue with 30 min of aerobic exercise without signs/symptoms of physical distress.   Intensity -- -- THRR unchanged -- THRR unchanged     Progression   Progression -- -- Continue to progress workloads to maintain intensity without signs/symptoms of physical distress. -- Continue to progress workloads to maintain intensity without signs/symptoms of physical distress.   Average METs -- -- 2.82 -- 3     Resistance Training   Training Prescription -- -- Yes -- Yes   Weight -- -- 4 lb -- 4 lb   Reps -- -- 10-15 -- 10-15     Interval Training   Interval Training -- -- No -- --     Treadmill   MPH -- -- 2.5 -- 3   Grade -- -- 0.5 -- 1   Minutes -- -- 15 -- 15   METs -- -- 3.09 -- 3.7     NuStep   Level -- -- 3 -- --   Minutes -- -- 15 -- --   METs -- -- 3 -- --     Elliptical   Level -- -- 1 -- --   Speed -- -- 2.5 -- --   Minutes -- -- 15 -- --     REL-XR   Level -- -- 3 -- 3  Speed -- -- -- -- 50   Minutes -- -- 15 -- 15   METs -- -- 2.2 -- --     Biostep-RELP   Level -- -- 3 -- --   Minutes -- -- 15 -- --   METs -- -- 3 -- --     Home Exercise Plan   Plans to continue exercise at -- -- -- Home (comment)  walking --   Frequency -- -- -- Add 2 additional days to program exercise sessions.  Start with adding 1 day/week, then increase to 2 --   Initial Home Exercises Provided -- -- -- 05/12/20 --   [place] Name 05/28/20 1400 06/11/20 1300           Response to Exercise   Blood Pressure (Admit) 122/60 126/72      Blood Pressure (Exercise) 176/72 176/74      Blood Pressure (Exit) 124/68 124/68      Heart Rate (Admit) 71 bpm 80 bpm      Heart Rate (Exercise) 101 bpm 113 bpm       Heart Rate (Exit) 81 bpm 79 bpm      Rating of Perceived Exertion (Exercise) 12 13      Symptoms none none      Duration Continue with 30 min of aerobic exercise without signs/symptoms of physical distress. Continue with 30 min of aerobic exercise without signs/symptoms of physical distress.      Intensity THRR unchanged THRR unchanged        Progression   Progression Continue to progress workloads to maintain intensity without signs/symptoms of physical distress. Continue to progress workloads to maintain intensity without signs/symptoms of physical distress.      Average METs 3.53 3.6        Resistance Training   Training Prescription Yes Yes      Weight 5 lb 6 lb      Reps 10-15 10-15        Interval Training   Interval Training No No        Treadmill   MPH 2.9 3      Grade 0.5 1      Minutes 15 15      METs 3.42 3.7        NuStep   Level 5 --      Minutes 15 --      METs 3.4 --        REL-XR   Level 2 5      Speed -- 50      Minutes 15 15      METs 2.3 3.4        Biostep-RELP   Level 5 --      Minutes 15 --      METs 5 --        Home Exercise Plan   Plans to continue exercise at Home (comment)  walking Home (comment)  walking      Frequency Add 2 additional days to program exercise sessions.  Start with adding 1 day/week, then increase to 2 Add 2 additional days to program exercise sessions.  Start with adding 1 day/week, then increase to 2      Initial Home Exercises Provided 05/12/20 05/12/20             Exercise Comments:   Exercise Goals and Review:  Exercise Goals    Row Name 04/10/20 1636             Exercise Goals  Increase Physical Activity Yes       Intervention Provide advice, education, support and counseling about physical activity/exercise needs.;Develop an individualized exercise prescription for aerobic and resistive training based on initial evaluation findings, risk stratification, comorbidities and participant's personal goals.        Expected Outcomes Short Term: Attend rehab on a regular basis to increase amount of physical activity.;Long Term: Add in home exercise to make exercise part of routine and to increase amount of physical activity.;Long Term: Exercising regularly at least 3-5 days a week.       Increase Strength and Stamina Yes       Intervention Provide advice, education, support and counseling about physical activity/exercise needs.;Develop an individualized exercise prescription for aerobic and resistive training based on initial evaluation findings, risk stratification, comorbidities and participant's personal goals.       Expected Outcomes Short Term: Increase workloads from initial exercise prescription for resistance, speed, and METs.;Short Term: Perform resistance training exercises routinely during rehab and add in resistance training at home;Long Term: Improve cardiorespiratory fitness, muscular endurance and strength as measured by increased METs and functional capacity (6MWT)       Able to understand and use rate of perceived exertion (RPE) scale Yes       Intervention Provide education and explanation on how to use RPE scale       Expected Outcomes Short Term: Able to use RPE daily in rehab to express subjective intensity level;Long Term:  Able to use RPE to guide intensity level when exercising independently       Able to understand and use Dyspnea scale Yes       Intervention Provide education and explanation on how to use Dyspnea scale       Expected Outcomes Short Term: Able to use Dyspnea scale daily in rehab to express subjective sense of shortness of breath during exertion;Long Term: Able to use Dyspnea scale to guide intensity level when exercising independently       Knowledge and understanding of Target Heart Rate Range (THRR) Yes       Intervention Provide education and explanation of THRR including how the numbers were predicted and where they are located for reference       Expected  Outcomes Short Term: Able to state/look up THRR;Short Term: Able to use daily as guideline for intensity in rehab;Long Term: Able to use THRR to govern intensity when exercising independently       Able to check pulse independently Yes       Intervention Provide education and demonstration on how to check pulse in carotid and radial arteries.;Review the importance of being able to check your own pulse for safety during independent exercise       Expected Outcomes Short Term: Able to explain why pulse checking is important during independent exercise;Long Term: Able to check pulse independently and accurately       Understanding of Exercise Prescription Yes       Intervention Provide education, explanation, and written materials on patient's individual exercise prescription       Expected Outcomes Short Term: Able to explain program exercise prescription;Long Term: Able to explain home exercise prescription to exercise independently              Exercise Goals Re-Evaluation :  Exercise Goals Re-Evaluation    Row Name 04/14/20 1400 04/30/20 0931 05/12/20 1514 05/13/20 0908 05/28/20 1425     Exercise Goal Re-Evaluation   Exercise Goals Review Increase Physical Activity;Able to  understand and use rate of perceived exertion (RPE) scale;Knowledge and understanding of Target Heart Rate Range (THRR);Understanding of Exercise Prescription;Increase Strength and Stamina;Able to check pulse independently Increase Physical Activity;Increase Strength and Stamina;Understanding of Exercise Prescription Increase Physical Activity;Increase Strength and Stamina;Understanding of Exercise Prescription Increase Physical Activity;Increase Strength and Stamina;Understanding of Exercise Prescription Increase Physical Activity;Increase Strength and Stamina;Understanding of Exercise Prescription   Comments Reviewed PLB technique with pt.  Talked about how it works and it's importance in maintaining their exercise  saturations. [name] is off to a good start in rehab.  He has already increased his handweights to 4 lb.  We will continue to monitor his progress. Reviewed home exercise with pt today.  Pt plans to walk for exercise. 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 Talked about starting off with smaller bouts of exercise (~10 minutes at a time) 3x/ day and slowly increasing the duration up to the complete 30 minutes of aerobic exercise at once. Will start off with adding 1 extra day/ week and increasing that thereafter. He also stated he is thinking about purchasing equipment to keep at his house. Reviewed THR, pulse, RPE, sign and symptoms, pulse oximetery and when to call 911 or MD.  Also discussed weather considerations and indoor options.  Pt voiced understanding. [name] is making steady progress with exercise.  Staff will monitor progress. [name] is doing well in rehab.  He is now up to 2.9 mph on the treadmill and using 5lb hand weights.  We will continue to monitor his progress.   Expected Outcomes Short: Become more profiecient at using PLB.   Long: Become independent at using PLB. Short: Increase NuStep and add incline to treadmill  Long: Continue to improve stamina. Short: Start adding in 1 day of exercise at home Long: Able to exercise independently at home with no complications Short: continue to exercise consistently Long:  increase overal MET level Short: Increase level on XR  Long; Conitnue to improve stamina   Row Name 06/02/20 1428 06/11/20 1309           Exercise Goal Re-Evaluation   Exercise Goals Review Increase Physical Activity;Increase Strength and Stamina;Understanding of Exercise Prescription Increase Physical Activity;Increase Strength and Stamina;Understanding of Exercise Prescription      Comments [name] is doing well in rehab.  He is feeling better overall.  He is walking for about 30 min on his off days and doing weights  too.  He does feel that his strength and stamina are getting better. [name] is progressing well and is up to level 5 on XR and up to 6 lb on weights.  Staff will monitor progress.      Expected Outcomes Short: Continue to walk on off days Long: Continue to improve stamina. Short:  continue to progress workloads Long: increase overall MET level             Discharge Exercise Prescription (Final Exercise Prescription Changes):  Exercise Prescription Changes - 06/11/20 1300      Response to Exercise   Blood Pressure (Admit) 126/72    Blood Pressure (Exercise) 176/74    Blood Pressure (Exit) 124/68    Heart Rate (Admit) 80 bpm    Heart Rate (Exercise) 113 bpm    Heart Rate (Exit) 79 bpm    Rating of Perceived Exertion (Exercise) 13    Symptoms none    Duration Continue with 30 min  of aerobic exercise without signs/symptoms of physical distress.    Intensity THRR unchanged      Progression   Progression Continue to progress workloads to maintain intensity without signs/symptoms of physical distress.    Average METs 3.6      Resistance Training   Training Prescription Yes    Weight 6 lb    Reps 10-15      Interval Training   Interval Training No      Treadmill   MPH 3    Grade 1    Minutes 15    METs 3.7      REL-XR   Level 5    Speed 50    Minutes 15    METs 3.4      Home Exercise Plan   Plans to continue exercise at Home (comment)   walking   Frequency Add 2 additional days to program exercise sessions.   Start with adding 1 day/week, then increase to 2   Initial Home Exercises Provided 05/12/20           Nutrition:  Target Goals: Understanding of nutrition guidelines, daily intake of sodium <1536m, cholesterol <2011m calories 30% from fat and 7% or less from saturated fats, daily to have 5 or more servings of fruits and vegetables.  Education: All About Nutrition: -Group instruction provided by verbal, written material, interactive activities, discussions,  models, and posters to present general guidelines for heart healthy nutrition including fat, fiber, MyPlate, the role of sodium in heart healthy nutrition, utilization of the nutrition label, and utilization of this knowledge for meal planning. Follow up email sent as well. Written material given at graduation.   [facility]  Date 06[facility]20  [facility] Review Code 1- Verbalizes Understanding      Biometrics:  Pre Biometrics - 04/10/20 1636      Pre Biometrics   Height 5' 8.2" (1.732 m)    Weight 199 lb (90.3 kg)    BMI (Calculated) 30.09    Single Leg Stand 30 seconds           Post Biometrics - 06/11/20 1428       Post  Biometrics   Height 5' 8.2" (1.732 m)    Weight 199 lb 3.2 oz (90.4 kg)    BMI (Calculated) 30.12           Nutrition Therapy Plan and Nutrition Goals:  Nutrition Therapy & Goals - 04/23/20 1333      Nutrition Therapy   Diet Heart healthy, Low Na    Drug/Food Interactions Statins/Certain Fruits    Protein (specify units) 75g    Fiber 30 grams    Whole Grain Foods 3 servings    Saturated Fats 12 max. grams    Fruits and Vegetables 5 servings/day    Sodium 1.5 grams      Personal Nutrition Goals   Nutrition Goal ST: increase vegetable intake and increase variety LT: Get stronger, feels like he is getting weaker and weaker. 7/10 right now    Comments Activity level has decreases. He feels like he is having muscle soreness. He will have a biscuit from [employer] for breakfast on the road. B: cheerios with banana and almond milk, restaurant breakfast, protein shake. L: meat with starch and vegetables D: same as lunch. Eats mostly red meat but will sometimes have chicken, fish and prok chops. Reports the vegetables he normally eats are potatoes and corn. Discussed variety  and MyPlate. Discuseed heart healhty eating. Coffee with sweetener - asked about artificial sweeteners - he has GI distress with  them, discussed how a moderate amount of sugar for his coffee can fit into a heart healthy diet - he does not like stevia.      Intervention Plan   Intervention Prescribe, educate and counsel regarding individualized specific dietary modifications aiming towards targeted core components such as weight, hypertension, lipid management, diabetes, heart failure and other comorbidities.;Nutrition handout(s) given to patient.    Expected Outcomes Short Term Goal: Understand basic principles of dietary content, such as calories, fat, sodium, cholesterol and nutrients.;Short Term Goal: A plan has been developed with personal nutrition goals set during dietitian appointment.;Long Term Goal: Adherence to prescribed nutrition plan.           Nutrition Assessments:  Nutrition Assessments - 06/16/20 1438      MEDFICTS Scores   Post Score 76          MEDIFICTS Score Key:  ?70 Need to make dietary changes   40-70 Heart Healthy Diet  ? 40 Therapeutic Level Cholesterol Diet   Picture Your Plate Scores:  <93 Unhealthy dietary pattern with much room for improvement.  41-50 Dietary pattern unlikely to meet recommendations for good health and room for improvement.  51-60 More healthful dietary pattern, with some room for improvement.   >60 Healthy dietary pattern, although there may be some specific behaviors that could be improved.    Nutrition Goals Re-Evaluation:  Nutrition Goals Re-Evaluation    [place] Name 05/14/20 1429 06/02/20 1432           Goals   Nutrition Goal ST: Continue increase vegetable intake and increase variety, replace red meat 2x/week with fish (shrimp and salmon or tuna and beans) LT: Get stronger, feels like he is getting weaker and weaker. 7/10 right now ST: Continue increase vegetable intake and increase variety, replace red meat 2x/week with fish (shrimp and salmon or tuna and beans) LT: Get stronger, feels like he is getting weaker and weaker. 7/10 right now       Comment Tim reports have added squash, zucchini, and carrots to dinner meal. Discussed Replacing red meat with another more heart healthy protein - gave some ideas for salmon as pt reports not knowing how to cook that. continue to add variety to vegetables. [name] is doing well with his diet.  He is trying to add in more vegetables and he is starting to like them a little more.  He knows he is going to cheat some this weekend, but will get back to it.      Expected Outcome ST: Continue increase vegetable intake and increase variety, replace red meat 2x/week with fish (shrimp and salmon or tuna and beans) OI:ZTIWPY red meat to 1-2x/week, eat out <3x/week, get a variety of fruits and vegetables. Short: Continue to add in vegetables  Long; Balance diet.             Nutrition Goals Discharge (Final Nutrition Goals Re-Evaluation):  Nutrition Goals Re-Evaluation - 06/02/20 1432      Goals   Nutrition Goal ST: Continue increase vegetable intake and increase variety, replace red meat 2x/week with fish (shrimp and salmon or tuna and beans) LT: Get stronger, feels like he is getting weaker and weaker. 7/10 right now    Comment [name] is doing well with his diet.  He is trying to add in more vegetables and he is starting to like them a little more.  He knows he is going to cheat some this weekend, but will get back to it.    Expected Outcome Short: Continue to add in vegetables  Long; Balance diet.           Psychosocial: Target Goals: Acknowledge presence or absence of significant depression and/or stress, maximize coping skills, provide positive support system. Participant is able to verbalize types and ability to use techniques and skills needed for reducing stress and depression.   Education: Stress, Anxiety, and Depression - Group verbal and visual presentation to define topics covered.  Reviews how body is impacted by stress, anxiety, and depression.  Also discusses healthy ways to reduce stress and to  treat/manage anxiety and depression.  Written material given at graduation.   Cardiac Rehab from 06/18/2020 in [facility] Cardiac and Pulmonary Rehab  Date 04/30/20  Educator [facility]  Instruction Review Code 1- [employer] Understanding      Education: Sleep Hygiene -Provides group verbal and written instruction about how sleep can affect your health.  Define sleep hygiene, discuss sleep cycles and impact of sleep habits. Review good sleep hygiene tips.    Initial Review & Psychosocial Screening:  Initial Psych Review & Screening - 04/08/20 0832      Initial Review   Current issues with None Identified      Family Dynamics   Good Support System? Yes    Comments He can look to his wife, daugher and family for support. He has a positive outlook on his health.      Barriers   Psychosocial barriers to participate in program There are no identifiable barriers or psychosocial needs.      Screening Interventions   Interventions Encouraged to exercise;Provide feedback about the scores to participant;To provide support and resources with identified psychosocial needs    Expected Outcomes Short Term goal: Utilizing psychosocial counselor, staff and physician to assist with identification of specific Stressors or current issues interfering with healing process. Setting desired goal for each stressor or current issue identified.;Long Term Goal: Stressors or current issues are controlled or eliminated.;Short Term goal: Identification and review with participant of any Quality of Life or Depression concerns found by scoring the questionnaire.;Long Term goal: The participant improves quality of Life and PHQ9 Scores as seen by post scores and/or verbalization of changes           Quality of Life Scores:   Quality of Life - 06/16/20 1441      Quality of Life Scores   Health/Function Post 24 %    Socioeconomic Post 28.44 %    Psych/Spiritual Post 24.86 %    Family Post 28.8 %    GLOBAL Post 25.87 %           Scores of 19 and below usually indicate a poorer quality of life in these areas.  A difference of  2-3 points is a clinically meaningful difference.  A difference of 2-3 points in the total score of the Quality of Life Index has been associated with significant improvement in overall quality of life, self-image, physical symptoms, and general health in studies assessing change in quality of life.  PHQ-9: Recent Review Flowsheet Data    Depression screen [facility] 2/9 06/16/2020 04/10/2020   Decreased Interest 0 1   Down, Depressed, Hopeless 0 1   PHQ - 2 Score 0 2   Altered sleeping 0 0   Tired, decreased energy 1 0   Change in appetite 0 0   Feeling bad or failure  about yourself  0 0   Trouble concentrating 1 0   Moving slowly or fidgety/restless 0 0   Suicidal thoughts 0 0   PHQ-9 Score 2 2   Difficult doing work/chores Not difficult at all Not difficult at all     Interpretation of Total Score  Total Score Depression Severity:  1-4 = Minimal depression, 5-9 = Mild depression, 10-14 = Moderate depression, 15-19 = Moderately severe depression, 20-27 = Severe depression   Psychosocial Evaluation and Intervention:  Psychosocial Evaluation - 04/08/20 0833      Psychosocial Evaluation & Interventions   Interventions Encouraged to exercise with the program and follow exercise prescription    Comments He can look to his wife, daugher and family for support. He has a positive outlook on his health.    Expected Outcomes Short: Exercise regularly to support mental health and notify staff of any changes. Long: maintain mental health and well being through teaching of rehab or prescribed medications independently.    Continue Psychosocial Services  Follow up required by staff           Psychosocial Re-Evaluation:  Psychosocial Re-Evaluation    [place] Name 05/14/20 1435 05/14/20 1523 05/14/20 1526 06/02/20 1429       Psychosocial Re-Evaluation   Current issues with -- Current Stress  Concerns -- Current Stress Concerns    Comments -- [name] is doing really well mentally. He states that he sleeps well at night. 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 He is due for his physical for truck driving in December and is really aiming to have stable blood pressures by then. He inquired about using meditation or breathing exercises to help lower his blood pressure as he sometimes feels anxious when he goes in for his physical test. Discussed using [email or web address] for breathing and meditation and patient is willing and excited to try it. -- [name] is doing well in rehab.  They are getting ready to go to [place] this weekend for a Christmas present as a family trip.  They have been planning this trip for over a year!  He denies any major stessors currently. He sleeps good.  Their schedule is their biggest stressor and he is thinking of picking up an extra day in rehab.    Expected Outcomes -- Short: Practice using [email or web address] exercises Long: Continue to maintain positive attitude -- Short: Enjoy vacation Long: Continue to stay positive.    Interventions -- Encouraged to attend Cardiac Rehabilitation for the exercise -- Encouraged to attend Cardiac Rehabilitation for the exercise    Continue Psychosocial Services  -- Follow up required by staff -- --           Psychosocial Discharge (Final Psychosocial Re-Evaluation):  Psychosocial Re-Evaluation - 06/02/20 1429      Psychosocial Re-Evaluation   Current issues with Current Stress Concerns    Comments [name] is doing well in rehab.  They are getting ready to go to [place] this weekend for a Christmas present as a family trip.  They have been planning this trip for over a year!  He denies any major stessors currently. He sleeps good.  Their schedule is their biggest stressor and he is thinking of picking up an extra day in rehab.    Expected Outcomes Short: Enjoy vacation  Long: Continue to stay positive.    Interventions Encouraged to attend Cardiac Rehabilitation for the exercise  Vocational Rehabilitation: Provide vocational rehab assistance to qualifying candidates.   Vocational Rehab Evaluation & Intervention:   Education: Education Goals: Education classes will be provided on a variety of topics geared toward better understanding of heart health and risk factor modification. Participant will state understanding/return demonstration of topics presented as noted by education test scores.  Learning Barriers/Preferences:  Learning Barriers/Preferences - 04/08/20 0831      Learning Barriers/Preferences   Learning Barriers None    Learning Preferences None           General Cardiac Education Topics:  AED/CPR: - Group verbal and written instruction with the use of models to demonstrate the basic use of the AED with the basic ABC's of resuscitation.   Anatomy and Cardiac Procedures: - Group verbal and visual presentation and models provide information about basic cardiac anatomy and function. Reviews the testing methods done to diagnose heart disease and the outcomes of the test results. Describes the treatment choices: Medical Management, Angioplasty, or Coronary Bypass Surgery for treating various heart conditions including Myocardial Infarction, Angina, Valve Disease, and Cardiac Arrhythmias.  Written material given at graduation.   Cardiac Rehab from 06/18/2020 in [facility] Cardiac and Pulmonary Rehab  Date 05/21/20  Educator [facility]  Instruction Review Code 1- Verbalizes Understanding      Medication Safety: - Group verbal and visual instruction to review commonly prescribed medications for heart and lung disease. Reviews the medication, class of the drug, and side effects. Includes the steps to properly store meds and maintain the prescription regimen.  Written material given at graduation.   Cardiac Rehab from 06/18/2020 in [facility] Cardiac  and Pulmonary Rehab  Date 06/11/20  Educator [facility]  Instruction Review Code 1- Verbalizes Understanding      Intimacy: - Group verbal instruction through game format to discuss how heart and lung disease can affect sexual intimacy. Written material given at graduation..   Cardiac Rehab from 06/18/2020 in [facility] Cardiac and Pulmonary Rehab  Date 05/14/20  Educator [facility]  Instruction Review Code 1- Verbalizes Understanding      Know Your Numbers and Heart Failure: - Group verbal and visual instruction to discuss disease risk factors for cardiac and pulmonary disease and treatment options.  Reviews associated critical values for Overweight/Obesity, Hypertension, Cholesterol, and Diabetes.  Discusses basics of heart failure: signs/symptoms and treatments.  Introduces Heart Failure Zone chart for action plan for heart failure.  Written material given at graduation.   Infection Prevention: - Provides verbal and written material to individual with discussion of infection control including proper hand washing and proper equipment cleaning during exercise session.   Cardiac Rehab from 06/18/2020 in [facility] Cardiac and Pulmonary Rehab  Date 04/08/20  Educator [facility]  Instruction Review Code 1- Verbalizes Understanding      Falls Prevention: - Provides verbal and written material to individual with discussion of falls prevention and safety.   Cardiac Rehab from 06/18/2020 in [facility] Cardiac and Pulmonary Rehab  Date 04/08/20  Educator [facility]  Instruction Review Code 1- Verbalizes Understanding      Other: -Provides group and verbal instruction on various topics (see comments)   Knowledge Questionnaire Score:  Knowledge Questionnaire Score - 06/16/20 1440      Knowledge Questionnaire Score   Post Score 24/26: angina, exercise           Core Components/Risk Factors/Patient Goals at Admission:  Personal Goals and Risk Factors at Admission - 04/10/20 1637      Core Components/Risk Factors/Patient  Goals on Admission  Weight Management Yes;Weight Loss    Intervention Weight Management: Develop a combined nutrition and exercise program designed to reach desired caloric intake, while maintaining appropriate intake of nutrient and fiber, sodium and fats, and appropriate energy expenditure required for the weight goal.;Weight Management: Provide education and appropriate resources to help participant work on and attain dietary goals.;Weight Management/Obesity: Establish reasonable short term and long term weight goals.    Admit Weight 199 lb (90.3 kg)    Goal Weight: Short Term 194 lb (88 kg)    Goal Weight: Long Term 189 lb (85.7 kg)    Expected Outcomes Short Term: Continue to assess and modify interventions until short term weight is achieved;Long Term: Adherence to nutrition and physical activity/exercise program aimed toward attainment of established weight goal;Weight Loss: Understanding of general recommendations for a balanced deficit meal plan, which promotes 1-2 lb weight loss per week and includes a negative energy balance of [phone] kcal/d;Understanding recommendations for meals to include 15-35% energy as protein, 25-35% energy from fat, 35-60% energy from carbohydrates, less than 233m of dietary cholesterol, 20-35 gm of total fiber daily;Understanding of distribution of calorie intake throughout the day with the consumption of 4-5 meals/snacks    Hypertension Yes    Intervention Monitor prescription use compliance.;Provide education on lifestyle modifcations including regular physical activity/exercise, weight management, moderate sodium restriction and increased consumption of fresh fruit, vegetables, and low fat dairy, alcohol moderation, and smoking cessation.    Expected Outcomes Short Term: Continued assessment and intervention until BP is < 140/959mHG in hypertensive participants. < 130/8033mG in hypertensive participants with diabetes, heart failure or chronic kidney  disease.;Long Term: Maintenance of blood pressure at goal levels.    Lipids Yes    Intervention Provide education and support for participant on nutrition & aerobic/resistive exercise along with prescribed medications to achieve LDL <70m19mDL >40mg45m Expected Outcomes Short Term: Participant states understanding of desired cholesterol values and is compliant with medications prescribed. Participant is following exercise prescription and nutrition guidelines.;Long Term: Cholesterol controlled with medications as prescribed, with individualized exercise RX and with personalized nutrition plan. Value goals: LDL < 70mg,68m > 40 mg.           Education:Diabetes - Individual verbal and written instruction to review signs/symptoms of diabetes, desired ranges of glucose level fasting, after meals and with exercise. Acknowledge that pre and post exercise glucose checks will be done for 3 sessions at entry of program.   Core Components/Risk Factors/Patient Goals Review:   Goals and Risk Factor Review    Row Name 05/14/20 1518 06/02/20 1433           Core Components/Risk Factors/Patient Goals Review   Personal Goals Review Weight Management/Obesity;Hypertension;Lipids Weight Management/Obesity;Hypertension;Lipids      Review Tim isOctavia Brucknering well. He continues to weigh himself at home and reports his weight remains stable within 1-2 pounds. He would like to lose weight, and is really focusing on eatier healthier. He recently spoke with the RD today to set new nutrition goals. He also plans to exercise more at home to also help. Has been checking his BP at home and stays around 130s [id number] and 70s di14Nolic. 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 Talked about keeping a log of his blood pressures to monitor closely. Remains compliance with medications. Tim isOctavia Brucknering well in rehab.  His weight has been stable.  He is trying to push as harder at  home. We talked  about using the staff videos and maybe adding in intervals in class to help lose weight.  He checks his pressures regularly and they have gotten better recently.  He is still working and wants to finish up rehab.      Expected Outcomes Short: Keep consisent log of BPs Long: Continue to manage lifestyle risk factors Short: Continue to work on [employer] Long: Continue to monitor risk factors             Core Components/Risk Factors/Patient Goals at Discharge (Final Review):   Goals and Risk Factor Review - 06/02/20 1433      Core Components/Risk Factors/Patient Goals Review   Personal Goals Review Weight Management/Obesity;Hypertension;Lipids    Review [name] is doing well in rehab.  His weight has been stable.  He is trying to push as harder at home. We talked about using the staff videos and maybe adding in intervals in class to help lose weight.  He checks his pressures regularly and they have gotten better recently.  He is still working and wants to finish up rehab.    Expected Outcomes Short: Continue to work on weight Long: Continue to monitor risk factors           ITP Comments:  ITP Comments    Row Name 04/08/20 0855 04/10/20 1855 04/14/20 1359 04/30/20 0516 05/28/20 0609   ITP Comments Virtual Visit completed. Patient informed on EP and RD appointment and 6 Minute walk test. Patient also informed of patient health questionnaires on My Chart. Patient Verbalizes understanding. Visit diagnosis can be found in [facility] 12/27/2019. Completed 6MWT and gym orientation. Initial ITP created and sent for review to Dr. Emily Filbert, Medical Director. First full day of exercise!  Patient was oriented to gym and equipment including functions, settings, policies, and procedures.  Patient's individual exercise prescription and treatment plan were reviewed.  All starting workloads were established based on the results of the 6 minute walk test done at initial orientation visit.  The plan for exercise progression was  also introduced and progression will be customized based on patient's performance and goals. 30 Day review completed. Medical Director ITP review done, changes made as directed, and signed approval by Medical Director. 30 Day review completed. Medical Director ITP review done, changes made as directed, and signed approval by Medical Director.   [place] Name 06/25/20 0909           ITP Comments 30 Day review completed. Medical Director ITP review done, changes made as directed, and signed approval by Medical Director.              Comments:

## 2020-06-30 ENCOUNTER — Ambulatory Visit: Payer: Medicare Other | Admitting: Dermatology

## 2020-06-30 ENCOUNTER — Telehealth: Payer: Self-pay

## 2020-06-30 ENCOUNTER — Other Ambulatory Visit: Payer: Self-pay

## 2020-06-30 DIAGNOSIS — L570 Actinic keratosis: Secondary | ICD-10-CM

## 2020-06-30 DIAGNOSIS — L80 Vitiligo: Secondary | ICD-10-CM

## 2020-06-30 DIAGNOSIS — L720 Epidermal cyst: Secondary | ICD-10-CM | POA: Diagnosis not present

## 2020-06-30 DIAGNOSIS — Z955 Presence of coronary angioplasty implant and graft: Secondary | ICD-10-CM | POA: Diagnosis not present

## 2020-06-30 DIAGNOSIS — L308 Other specified dermatitis: Secondary | ICD-10-CM | POA: Diagnosis not present

## 2020-06-30 DIAGNOSIS — L578 Other skin changes due to chronic exposure to nonionizing radiation: Secondary | ICD-10-CM

## 2020-06-30 DIAGNOSIS — L821 Other seborrheic keratosis: Secondary | ICD-10-CM

## 2020-06-30 DIAGNOSIS — Z1283 Encounter for screening for malignant neoplasm of skin: Secondary | ICD-10-CM

## 2020-06-30 MED ORDER — OPZELURA 1.5 % EX CREA: 1.0000 "application " | TOPICAL_CREAM | Freq: Every day | CUTANEOUS | 3 refills | Status: DC

## 2020-06-30 NOTE — Telephone Encounter (Signed)
[place] not covered. Patient is 16 and has medicare has secondary. Please advise of change.

## 2020-06-30 NOTE — Patient Instructions (Addendum)
Wound Care Instructions  1. Cleanse wound gently with soap and water once a day then pat dry with clean gauze. Apply a thing coat of Petrolatum (petroleum jelly, "Vaseline") over the wound (unless you have an allergy to this). We recommend that you use a new, sterile tube of Vaseline. Do not pick or remove scabs. Do not remove the yellow or white "healing tissue" from the base of the wound.  2. Cover the wound with fresh, clean, nonstick gauze and secure with paper tape. You may use Band-Aids in place of gauze and tape if the would is small enough, but would recommend trimming much of the tape off as there is often too much. Sometimes Band-Aids can irritate the skin.  3. You should call the office for your biopsy report after 1 week if you have not already been contacted.  4. If you experience any problems, such as abnormal amounts of bleeding, swelling, significant bruising, significant pain, or evidence of infection, please call the office immediately.  5. FOR ADULT SURGERY PATIENTS: If you need something for pain relief you may take 1 extra strength Tylenol (acetaminophen) AND 2 Ibuprofen (200mg  each) together every 4 hours as needed for pain. (do not take these if you are allergic to them or if you have a reason you should not take them.) Typically, you may only need pain medication for 1 to 3 days.    Reviewed chronic nature, no cure and can be difficult to treat.  Vitiligo is an autoimmune condition which causes loss of skin pigment and is commonly seen on the face and may also involve areas of trauma like hands, elbows, knees, and ankles.  Treatments include topical steroids and other topical anti-inflammatory ointments/creams.  Sometimes narrow band UV light therapy or Xtrac laser is helpful, both of which require twice weekly treatments for at least 3-6 months.  Antioxidant vitamins, such as Vitamins C&E, and alpha lipoic acid may be added to enhance treatment.

## 2020-06-30 NOTE — Progress Notes (Signed)
Daily Session Note  Patient Details  Name: Richard Webster MRN: [id number] Date of Birth: 07/16/1956 Referring Provider:     Cardiac Rehab from 04/10/2020 in [facility] Cardiac and Pulmonary Rehab  Referring Provider Dr. Dwayne Callwood      Encounter Date: 06/30/2020  Check In:  Session Check In - 06/30/20 1429      Check-In   Supervising physician immediately available to respond to emergencies See telemetry face sheet for immediately available ER MD    Location [facility]-Cardiac & Pulmonary Rehab    Staff Present Kelly Bollinger, MPA, RN;Kelly Hayes, BS, ACSM CEP, Exercise Physiologist;Kara Langdon, MS Exercise Physiologist    Virtual Visit No    Medication changes reported     No    Fall or balance concerns reported    No    Warm-up and Cool-down Performed on first and last piece of equipment    Resistance Training Performed Yes    VAD Patient? No    PAD/SET Patient? No      Pain Assessment   Currently in Pain? No/denies              Social History   Tobacco Use  Smoking Status Former Smoker  . Packs/day: 0.50  . Years: 15.00  . Pack years: 7.50  . Types: Cigarettes  . Quit date: 09/04/1983  . Years since quitting: 36.8  Smokeless Tobacco Former User    Goals Met:  Independence with exercise equipment Exercise tolerated well No report of cardiac concerns or symptoms Strength training completed today  Goals Unmet:  Not Applicable  Comments: Pt able to follow exercise prescription today without complaint.  Will continue to monitor for progression.    Dr. Mark Miller is Medical Director for [facility] and [facility]. 

## 2020-06-30 NOTE — Progress Notes (Signed)
   New Patient Visit  Subjective  Richard Webster is a 64 y.o. male who presents for the following: Other (Rough spots on forehead and forearms.). The patient presents for Upper Body Skin Exam (UBSE) for skin cancer screening and mole check.  The following portions of the chart were reviewed this encounter and updated as appropriate:   Tobacco  Allergies  Meds  Problems  Med Hx  Surg Hx  Fam Hx     Review of Systems:  No other skin or systemic complaints except as noted in HPI or Assessment and Plan.  Objective  Well appearing patient in no apparent distress; mood and affect are within normal limits.  All skin waist up examined.  Objective  Face x 6, right arm x 3, left arm x 4 (13): Erythematous thin papules/macules with gritty scale.   Objective  Face, arms, hands: Depigmented patches.  Objective  Chest: Smooth white papule(s).   Objective  Right Breast: Eczematous patches within vitiligo patches.  Images     Assessment & Plan    Actinic Damage - chronic, secondary to cumulative UV radiation exposure/sun exposure over time - diffuse scaly erythematous macules with underlying dyspigmentation - Recommend daily broad spectrum sunscreen SPF 30+ to sun-exposed areas, reapply every 2 hours as needed.  - Call for new or changing lesions.  Seborrheic Keratoses - Stuck-on, waxy, tan-brown papules and plaques  - Discussed benign etiology and prognosis. - Observe - Call for any changes   AK (actinic keratosis) (13) Face x 6, right arm x 3, left arm x 4  Destruction of lesion - Face x 6, right arm x 3, left arm x 4 Complexity: simple   Destruction method: cryotherapy   Informed consent: discussed and consent obtained   Timeout:  patient name, date of birth, surgical site, and procedure verified Lesion destroyed using liquid nitrogen: Yes   Region frozen until ice ball extended beyond lesion: Yes   Outcome: patient tolerated procedure well with no  complications   Post-procedure details: wound care instructions given    Vitiligo Face, arms, hands  Reviewed chronic nature, no cure and can be difficult to treat.  Vitiligo is an autoimmune condition which causes loss of skin pigment and is commonly seen on the face and may also involve areas of trauma like hands, elbows, knees, and ankles.  Treatments include topical steroids and other topical anti-inflammatory ointments/creams.  Sometimes narrow band UV light therapy or Xtrac laser is helpful, both of which require twice weekly treatments for at least 3-6 months.  Antioxidant vitamins, such as Vitamins C&E, and alpha lipoic acid may be added to enhance treatment.  Discussed topical steroid vs Opzelura.  Milia Chest Benign, observe.    Eczema /atopic dermatitis Right Breast Discussed topical steroid vs Opzelura.  Start Opzelura cream qd to affected areas of chest  Atopic dermatitis (eczema) is a chronic, relapsing, pruritic condition that can significantly affect quality of life. It is often associated with allergic rhinitis and/or asthma and can require treatment with topical medications, phototherapy, or in severe cases a biologic medication called Dupixent.   Ruxolitinib Phosphate (OPZELURA) 1.5 % CREA - Right Breast  Return in about 3 months (around 09/28/2020) for AK follow up.   I, Richard Webster, CMA, am acting as scribe for Richard Ser, MD .  Documentation: I have reviewed the above documentation for accuracy and completeness, and I agree with the above.  Richard Ser, MD

## 2020-06-30 NOTE — Telephone Encounter (Signed)
Send in Protopic ointment qd-bid to aa rash of chest disp 100 g with 3rf

## 2020-07-01 MED ORDER — TACROLIMUS 0.1 % EX OINT: TOPICAL_OINTMENT | CUTANEOUS | 3 refills | Status: DC

## 2020-07-01 NOTE — Telephone Encounter (Signed)
RX sent in and patient advised of medication change. 

## 2020-07-02 ENCOUNTER — Other Ambulatory Visit: Payer: Self-pay

## 2020-07-02 DIAGNOSIS — Z955 Presence of coronary angioplasty implant and graft: Secondary | ICD-10-CM

## 2020-07-02 NOTE — Progress Notes (Signed)
Daily Session Note  Patient Details  Name: XAVIUS SPADAFORE MRN: [id number] Date of Birth: 1956/01/14 Referring Provider:     Cardiac Rehab from 04/10/2020 in [facility] Cardiac and Pulmonary Rehab  Referring Provider Dr. Lujean Amel      Encounter Date: 07/02/2020  Check In:  Session Check In - 07/02/20 1359      Check-In   Supervising physician immediately available to respond to emergencies See telemetry face sheet for immediately available ER MD    Location [facility]-Cardiac & Pulmonary Rehab    Staff Present Birdie Sons, MPA, RN;Melissa Caiola RDN, LDN;Joseph Tessie Fass RCP,RRT,BSRT    Virtual Visit No    Medication changes reported     No    Fall or balance concerns reported    No    Warm-up and Cool-down Performed on first and last piece of equipment    Resistance Training Performed Yes    VAD Patient? No    PAD/SET Patient? No      Pain Assessment   Currently in Pain? No/denies              Social History   Tobacco Use  Smoking Status Former Smoker  . Packs/day: 0.50  . Years: 15.00  . Pack years: 7.50  . Types: Cigarettes  . Quit date: 09/04/1983  . Years since quitting: 36.8  Smokeless Tobacco Former Systems developer    Goals Met:  Independence with exercise equipment Exercise tolerated well No report of cardiac concerns or symptoms Strength training completed today  Goals Unmet:  Not Applicable  Comments: Pt able to follow exercise prescription today without complaint.  Will continue to monitor for progression.    Dr. Emily Filbert is Medical Director for [place] and [facility].

## 2020-07-07 ENCOUNTER — Encounter: Payer: Self-pay | Admitting: Dermatology

## 2020-07-07 ENCOUNTER — Other Ambulatory Visit: Payer: Self-pay

## 2020-07-07 DIAGNOSIS — Z955 Presence of coronary angioplasty implant and graft: Secondary | ICD-10-CM

## 2020-07-07 NOTE — Progress Notes (Signed)
Daily Session Note  Patient Details  Name: Richard Webster MRN: [id number] Date of Birth: 25-Sep-1955 Referring Provider:   Flowsheet Row Cardiac Rehab from 04/10/2020 in [facility] Cardiac and Pulmonary Rehab  Referring Provider Dr. Lujean Amel      Encounter Date: 07/07/2020  Check In:  Session Check In - 07/07/20 1416      Check-In   Supervising physician immediately available to respond to emergencies See telemetry face sheet for immediately available ER MD    Location [facility]-Cardiac & Pulmonary Rehab    Staff Present Birdie Sons, MPA, Mauricia Area, BS, ACSM CEP, Exercise Physiologist;Kara Eliezer Bottom, MS Exercise Physiologist    Virtual Visit No    Medication changes reported     No    Fall or balance concerns reported    No    Warm-up and Cool-down Performed on first and last piece of equipment    Resistance Training Performed Yes    VAD Patient? No    PAD/SET Patient? No      Pain Assessment   Currently in Pain? No/denies              Social History   Tobacco Use  Smoking Status Former Smoker  . Packs/day: 0.50  . Years: 15.00  . Pack years: 7.50  . Types: Cigarettes  . Quit date: 09/04/1983  . Years since quitting: 36.8  Smokeless Tobacco Former Systems developer    Goals Met:  Independence with exercise equipment Exercise tolerated well No report of cardiac concerns or symptoms Strength training completed today  Goals Unmet:  Not Applicable  Comments: Pt able to follow exercise prescription today without complaint.  Will continue to monitor for progression.    Dr. Emily Filbert is Medical Director for [place] and [facility].

## 2020-07-07 NOTE — Patient Instructions (Signed)
Discharge Patient Instructions  Patient Details  Name: Richard Webster MRN: [id number] Date of Birth: [birth year] Referring Provider:  Yolonda Kida, MD   Number of Visits: 14  Reason for Discharge:  Patient reached a stable level of exercise. Patient independent in their exercise. Patient has met program and personal goals.  Smoking History:  Social History   Tobacco Use  Smoking Status Former Smoker  . Packs/day: 0.50  . Years: 15.00  . Pack years: 7.50  . Types: Cigarettes  . Quit date: 09/04/1983  . Years since quitting: 36.8  Smokeless Tobacco Former User    Diagnosis:  Status post coronary artery stent placement  Initial Exercise Prescription:  Initial Exercise Prescription - 04/10/20 1600      Date of Initial Exercise RX and Referring Provider   Date 04/10/20    Referring Provider Dr. Lujean Amel      Treadmill   MPH 2.5    Grade 0.5    Minutes 15    METs 3.09      NuStep   Level 3    SPM 80    Minutes 15    METs 3.2      REL-XR   Level 2    Speed 50    Minutes 15    METs 3.2      T5 Nustep   Level 2    SPM 80    Minutes 15    METs 3.2      Biostep-RELP   Level 2    SPM 50    Minutes 15    METs 3.2      Prescription Details   Frequency (times per week) 2    Duration Progress to 30 minutes of continuous aerobic without signs/symptoms of physical distress      Intensity   THRR 40-80% of Max Heartrate 96-135    Ratings of Perceived Exertion 11-13    Perceived Dyspnea 0-4      Progression   Progression Continue to progress workloads to maintain intensity without signs/symptoms of physical distress.      Resistance Training   Training Prescription Yes    Weight 3 lb    Reps 10-15           Discharge Exercise Prescription (Final Exercise Prescription Changes):  Exercise Prescription Changes - 06/25/20 1300      Response to Exercise   Blood Pressure (Admit) 140/72    Blood Pressure (Exercise) 178/80    Blood Pressure  (Exit) 132/68    Heart Rate (Admit) 75 bpm    Heart Rate (Exercise) 113 bpm    Heart Rate (Exit) 83 bpm    Rating of Perceived Exertion (Exercise) 15    Symptoms none    Duration Continue with 30 min of aerobic exercise without signs/symptoms of physical distress.    Intensity THRR unchanged      Progression   Progression Continue to progress workloads to maintain intensity without signs/symptoms of physical distress.    Average METs 3.1      Resistance Training   Training Prescription Yes    Weight 6 lb    Reps 10-15      Interval Training   Interval Training No      Treadmill   MPH 3    Grade 1    Minutes 15    METs 3.7      Elliptical   Level 1    Minutes 15    METs 2.5  Functional Capacity:  6 Minute Walk    Row Name 04/10/20 1626 06/11/20 1427       6 Minute Walk   Phase Initial Discharge    Distance 1472 feet 1580 feet    Distance % Change -- 7.3 %    Distance Feet Change -- 108 ft    Walk Time 6 minutes 6 minutes    # of Rest Breaks 0 0    MPH 2.78 2.99    METS 3.25 3.82    RPE 9 13    Perceived Dyspnea  0 0    VO2 Peak 11.4 13.36    Symptoms No No    Resting HR 58 bpm 72 bpm    Resting BP 118/70 126/62    Resting Oxygen Saturation  97 % 97 %    Exercise Oxygen Saturation  during 6 min walk 98 % 98 %    Max Ex. HR 85 bpm 104 bpm    Max Ex. BP 142/74 164/64    2 Minute Post BP 120/68 --          Nutrition & Weight - Outcomes:  Pre Biometrics - 04/10/20 1636      Pre Biometrics   Height 5' 8.2" (1.732 m)    Weight 199 lb (90.3 kg)    BMI (Calculated) 30.09    Single Leg Stand 30 seconds           Post Biometrics - 06/11/20 1428       Post  Biometrics   Height 5' 8.2" (1.732 m)    Weight 199 lb 3.2 oz (90.4 kg)    BMI (Calculated) 30.12           Nutrition:  Nutrition Therapy & Goals - 04/23/20 1333      Nutrition Therapy   Diet Heart healthy, Low Na    Drug/Food Interactions Statins/Certain Fruits    Protein  (specify units) 75g    Fiber 30 grams    Whole Grain Foods 3 servings    Saturated Fats 12 max. grams    Fruits and Vegetables 5 servings/day    Sodium 1.5 grams      Personal Nutrition Goals   Nutrition Goal ST: increase vegetable intake and increase variety LT: Get stronger, feels like he is getting weaker and weaker. 7/10 right now    Comments Activity level has decreases. He feels like he is having muscle soreness. He will have a biscuit from [employer] for breakfast on the road. B: cheerios with banana and almond milk, restaurant breakfast, protein shake. L: meat with starch and vegetables D: same as lunch. Eats mostly red meat but will sometimes have chicken, fish and prok chops. Reports the vegetables he normally eats are potatoes and corn. Discussed variety and MyPlate. Discuseed heart healhty eating. Coffee with sweetener - asked about artificial sweeteners - he has GI distress with them, discussed how a moderate amount of sugar for his coffee can fit into a heart healthy diet - he does not like stevia.      Intervention Plan   Intervention Prescribe, educate and counsel regarding individualized specific dietary modifications aiming towards targeted core components such as weight, hypertension, lipid management, diabetes, heart failure and other comorbidities.;Nutrition handout(s) given to patient.    Expected Outcomes Short Term Goal: Understand basic principles of dietary content, such as calories, fat, sodium, cholesterol and nutrients.;Short Term Goal: A plan has been developed with personal nutrition goals set during dietitian appointment.;Long Term Goal: Adherence to  prescribed nutrition plan.          Goals reviewed with patient; copy given to patient.

## 2020-07-09 ENCOUNTER — Encounter: Payer: Medicare Other | Admitting: *Deleted

## 2020-07-09 ENCOUNTER — Other Ambulatory Visit: Payer: Self-pay

## 2020-07-09 DIAGNOSIS — Z955 Presence of coronary angioplasty implant and graft: Secondary | ICD-10-CM | POA: Diagnosis not present

## 2020-07-09 NOTE — Progress Notes (Signed)
Discharge Progress Report  Patient Details  Name: Richard Webster MRN: [id number] Date of Birth: 08-28-1955 Referring Provider:   Flowsheet Row Cardiac Rehab from 04/10/2020 in [facility] Cardiac and Pulmonary Rehab  Referring Provider Dr. Lujean Amel       Number of Visits: 36  Reason for Discharge:  Patient reached a stable level of exercise. Patient independent in their exercise. Patient has met program and personal goals.  Smoking History:  Social History   Tobacco Use  Smoking Status Former Smoker  . Packs/day: 0.50  . Years: 15.00  . Pack years: 7.50  . Types: Cigarettes  . Quit date: 09/04/1983  . Years since quitting: 36.8  Smokeless Tobacco Former User    Diagnosis:  Status post coronary artery stent placement  ADL UCSD:   Initial Exercise Prescription:  Initial Exercise Prescription - 04/10/20 1600      Date of Initial Exercise RX and Referring Provider   Date 04/10/20    Referring Provider Dr. Lujean Amel      Treadmill   MPH 2.5    Grade 0.5    Minutes 15    METs 3.09      NuStep   Level 3    SPM 80    Minutes 15    METs 3.2      REL-XR   Level 2    Speed 50    Minutes 15    METs 3.2      T5 Nustep   Level 2    SPM 80    Minutes 15    METs 3.2      Biostep-RELP   Level 2    SPM 50    Minutes 15    METs 3.2      Prescription Details   Frequency (times per week) 2    Duration Progress to 30 minutes of continuous aerobic without signs/symptoms of physical distress      Intensity   THRR 40-80% of Max Heartrate 96-135    Ratings of Perceived Exertion 11-13    Perceived Dyspnea 0-4      Progression   Progression Continue to progress workloads to maintain intensity without signs/symptoms of physical distress.      Resistance Training   Training Prescription Yes    Weight 3 lb    Reps 10-15           Discharge Exercise Prescription (Final Exercise Prescription Changes):  Exercise Prescription Changes - 06/25/20 1300       Response to Exercise   Blood Pressure (Admit) 140/72    Blood Pressure (Exercise) 178/80    Blood Pressure (Exit) 132/68    Heart Rate (Admit) 75 bpm    Heart Rate (Exercise) 113 bpm    Heart Rate (Exit) 83 bpm    Rating of Perceived Exertion (Exercise) 15    Symptoms none    Duration Continue with 30 min of aerobic exercise without signs/symptoms of physical distress.    Intensity THRR unchanged      Progression   Progression Continue to progress workloads to maintain intensity without signs/symptoms of physical distress.    Average METs 3.1      Resistance Training   Training Prescription Yes    Weight 6 lb    Reps 10-15      Interval Training   Interval Training No      Treadmill   MPH 3    Grade 1    Minutes 15    METs 3.7  Elliptical   Level 1    Minutes 15    METs 2.5           Functional Capacity:  6 Minute Walk    Row Name 04/10/20 1626 06/11/20 1427       6 Minute Walk   Phase Initial Discharge    Distance 1472 feet 1580 feet    Distance % Change -- 7.3 %    Distance Feet Change -- 108 ft    Walk Time 6 minutes 6 minutes    # of Rest Breaks 0 0    MPH 2.78 2.99    METS 3.25 3.82    RPE 9 13    Perceived Dyspnea  0 0    VO2 Peak 11.4 13.36    Symptoms No No    Resting HR 58 bpm 72 bpm    Resting BP 118/70 126/62    Resting Oxygen Saturation  97 % 97 %    Exercise Oxygen Saturation  during 6 min walk 98 % 98 %    Max Ex. HR 85 bpm 104 bpm    Max Ex. BP 142/74 164/64    2 Minute Post BP 120/68 --           Psychological, QOL, Others - Outcomes: PHQ 2/9: Depression screen [facility] 2/9 06/16/2020 04/10/2020  Decreased Interest 0 1  Down, Depressed, Hopeless 0 1  PHQ - 2 Score 0 2  Altered sleeping 0 0  Tired, decreased energy 1 0  Change in appetite 0 0  Feeling bad or failure about yourself  0 0  Trouble concentrating 1 0  Moving slowly or fidgety/restless 0 0  Suicidal thoughts 0 0  PHQ-9 Score 2 2  Difficult doing  work/chores Not difficult at all Not difficult at all    Quality of Life:  Quality of Life - 06/16/20 1441      Quality of Life Scores   Health/Function Post 24 %    Socioeconomic Post 28.44 %    Psych/Spiritual Post 24.86 %    Family Post 28.8 %    GLOBAL Post 25.87 %          Nutrition & Weight - Outcomes:  Pre Biometrics - 04/10/20 1636      Pre Biometrics   Height 5' 8.2" (1.732 m)    Weight 199 lb (90.3 kg)    BMI (Calculated) 30.09    Single Leg Stand 30 seconds           Post Biometrics - 06/11/20 1428       Post  Biometrics   Height 5' 8.2" (1.732 m)    Weight 199 lb 3.2 oz (90.4 kg)    BMI (Calculated) 30.12           Nutrition:  Nutrition Therapy & Goals - 04/23/20 1333      Nutrition Therapy   Diet Heart healthy, Low Na    Drug/Food Interactions Statins/Certain Fruits    Protein (specify units) 75g    Fiber 30 grams    Whole Grain Foods 3 servings    Saturated Fats 12 max. grams    Fruits and Vegetables 5 servings/day    Sodium 1.5 grams      Personal Nutrition Goals   Nutrition Goal ST: increase vegetable intake and increase variety LT: Get stronger, feels like he is getting weaker and weaker. 7/10 right now    Comments Activity level has decreases. He feels like he is having muscle soreness.  He will have a biscuit from [employer] for breakfast on the road. B: cheerios with banana and almond milk, restaurant breakfast, protein shake. L: meat with starch and vegetables D: same as lunch. Eats mostly red meat but will sometimes have chicken, fish and prok chops. Reports the vegetables he normally eats are potatoes and corn. Discussed variety and MyPlate. Discuseed heart healhty eating. Coffee with sweetener - asked about artificial sweeteners - he has GI distress with them, discussed how a moderate amount of sugar for his coffee can fit into a heart healthy diet - he does not like stevia.      Intervention Plan   Intervention Prescribe, educate and  counsel regarding individualized specific dietary modifications aiming towards targeted core components such as weight, hypertension, lipid management, diabetes, heart failure and other comorbidities.;Nutrition handout(s) given to patient.    Expected Outcomes Short Term Goal: Understand basic principles of dietary content, such as calories, fat, sodium, cholesterol and nutrients.;Short Term Goal: A plan has been developed with personal nutrition goals set during dietitian appointment.;Long Term Goal: Adherence to prescribed nutrition plan.           Nutrition Discharge:  Nutrition Assessments - 06/16/20 1438      MEDFICTS Scores   Post Score 76           Education Questionnaire Score:  Knowledge Questionnaire Score - 06/16/20 1440      Knowledge Questionnaire Score   Post Score 24/26: angina, exercise           Goals reviewed with patient; copy given to patient.

## 2020-07-09 NOTE — Progress Notes (Signed)
Cardiac Individual Treatment Plan  Patient Details  Name: KONA LOVER MRN: [id number] Date of Birth: 17-Jun-1956 Referring Provider:   Flowsheet Row Cardiac Rehab from 04/10/2020 in [facility] Cardiac and Pulmonary Rehab  Referring Provider Dr. Lujean Amel      Initial Encounter Date:  Flowsheet Row Cardiac Rehab from 04/10/2020 in [facility] Cardiac and Pulmonary Rehab  Date 04/10/20      Visit Diagnosis: Status post coronary artery stent placement  Patient's Home Medications on Admission:  Current Outpatient Medications:  .  aspirin EC 81 MG tablet, Take 81 mg by mouth daily., Disp: , Rfl:  .  clopidogrel (PLAVIX) 75 MG tablet, Take by mouth., Disp: , Rfl:  .  etodolac (LODINE) 400 MG tablet, Take 400 mg by mouth daily., Disp: , Rfl:  .  naproxen sodium (ALEVE) 220 MG tablet, Take 220 mg by mouth daily as needed (pain). (Patient not taking: Reported on 04/08/2020), Disp: , Rfl:  .  nitroGLYCERIN (NITROSTAT) 0.4 MG SL tablet, Place under the tongue., Disp: , Rfl:  .  olmesartan (BENICAR) 20 MG tablet, Take by mouth., Disp: , Rfl:  .  olmesartan-hydrochlorothiazide (BENICAR HCT) 20-12.5 MG tablet, Take 1 tablet by mouth daily., Disp: , Rfl:  .  omeprazole (PRILOSEC) 40 MG capsule, Take 40 mg by mouth daily before breakfast., Disp: , Rfl:  .  pantoprazole (PROTONIX) 20 MG tablet, Take 1 tablet by mouth daily., Disp: , Rfl:  .  rosuvastatin (CRESTOR) 20 MG tablet, Take by mouth., Disp: , Rfl:  .  Ruxolitinib Phosphate (OPZELURA) 1.5 % CREA, Apply 1 application topically daily., Disp: 60 g, Rfl: 3 .  tacrolimus (PROTOPIC) 0.1 % ointment, Apply topically as directed. Apply once to twice as needed to affected area of rash on chest, Disp: 100 g, Rfl: 3  Past Medical History: Past Medical History:  Diagnosis Date  . Anxiety    pt denies  . Arthritis   . Atypical chest pain   . BPH (benign prostatic hyperplasia)   . Chronic kidney disease    kidney stones  . H/O rheumatic heart disease    . Heart murmur   . History of right bundle branch block (RBBB)   . Hypertension   . Palpitations   . Prostatitis   . Rheumatic fever     Tobacco Use: Social History   Tobacco Use  Smoking Status Former Smoker  . Packs/day: 0.50  . Years: 15.00  . Pack years: 7.50  . Types: Cigarettes  . Quit date: 09/04/1983  . Years since quitting: 36.8  Smokeless Tobacco Former Geophysical data processor: Recent Chemical engineer   There is no flowsheet data to display.      Exercise Target Goals: Exercise Program Goal: Individual exercise prescription set using results from initial 6 min walk test and THRR while considering  patient's activity barriers and safety.   Exercise Prescription Goal: Initial exercise prescription builds to 30-45 minutes a day of aerobic activity, 2-3 days per week.  Home exercise guidelines will be given to patient during program as part of exercise prescription that the participant will acknowledge.   Education: Aerobic Exercise: - Group verbal and visual presentation on the components of exercise prescription. Introduces F.I.T.T principle from ACSM for exercise prescriptions.  Reviews F.I.T.T. principles of aerobic exercise including progression. Written material given at graduation. Flowsheet Row Cardiac Rehab from 06/25/2020 in [facility] Cardiac and Pulmonary Rehab  Date 05/21/20  Hilda Blades only 10/27]  Educator [facility]  Instruction Review Code 1-  Verbalizes Understanding      Education: Resistance Exercise: - Group verbal and visual presentation on the components of exercise prescription. Introduces F.I.T.T principle from ACSM for exercise prescriptions  Reviews F.I.T.T. principles of resistance exercise including progression. Written material given at graduation.    Education: Exercise & Equipment Safety: - Individual verbal instruction and demonstration of equipment use and safety with use of the equipment. Flowsheet Row Cardiac Rehab from 06/25/2020 in [facility]  Cardiac and Pulmonary Rehab  Date 04/08/20  Educator [facility]  Instruction Review Code 1- Verbalizes Understanding      Education: Exercise Physiology & General Exercise Guidelines: - Group verbal and written instruction with models to review the exercise physiology of the cardiovascular system and associated critical values. Provides general exercise guidelines with specific guidelines to those with heart or lung disease.  Flowsheet Row Cardiac Rehab from 06/25/2020 in [facility] Cardiac and Pulmonary Rehab  Date 05/07/20  Educator [facility]  Instruction Review Code 1- Verbalizes Understanding      Education: Flexibility, Balance, Mind/Body Relaxation: - Group verbal and visual presentation with interactive activity on the components of exercise prescription. Introduces F.I.T.T principle from ACSM for exercise prescriptions. Reviews F.I.T.T. principles of flexibility and balance exercise training including progression. Also discusses the mind body connection.  Reviews various relaxation techniques to help reduce and manage stress (i.e. Deep breathing, progressive muscle relaxation, and visualization). Balance handout provided to take home. Written material given at graduation. Flowsheet Row Cardiac Rehab from 06/25/2020 in [facility] Cardiac and Pulmonary Rehab  Date 05/28/20  Educator AS  Instruction Review Code 1- Verbalizes Understanding      Activity Barriers & Risk Stratification:  Activity Barriers & Cardiac Risk Stratification - 04/10/20 1638      Activity Barriers & Cardiac Risk Stratification   Activity Barriers Arthritis   Arthritis left foot   Cardiac Risk Stratification Moderate           6 Minute Walk:  6 Minute Walk    Row Name 04/10/20 1626 06/11/20 1427       6 Minute Walk   Phase Initial Discharge    Distance 1472 feet 1580 feet    Distance % Change -- 7.3 %    Distance Feet Change -- 108 ft    Walk Time 6 minutes 6 minutes    # of Rest Breaks 0 0    MPH 2.78 2.99    METS 3.25  3.82    RPE 9 13    Perceived Dyspnea  0 0    VO2 Peak 11.4 13.36    Symptoms No No    Resting HR 58 bpm 72 bpm    Resting BP 118/70 126/62    Resting Oxygen Saturation  97 % 97 %    Exercise Oxygen Saturation  during 6 min walk 98 % 98 %    Max Ex. HR 85 bpm 104 bpm    Max Ex. BP 142/74 164/64    2 Minute Post BP 120/68 --           Oxygen Initial Assessment:   Oxygen Re-Evaluation:   Oxygen Discharge (Final Oxygen Re-Evaluation):   Initial Exercise Prescription:  Initial Exercise Prescription - 04/10/20 1600      Date of Initial Exercise RX and Referring Provider   Date 04/10/20    Referring Provider Dr. Lujean Amel      Treadmill   MPH 2.5    Grade 0.5    Minutes 15    METs 3.09  NuStep   Level 3    SPM 80    Minutes 15    METs 3.2      REL-XR   Level 2    Speed 50    Minutes 15    METs 3.2      T5 Nustep   Level 2    SPM 80    Minutes 15    METs 3.2      Biostep-RELP   Level 2    SPM 50    Minutes 15    METs 3.2      Prescription Details   Frequency (times per week) 2    Duration Progress to 30 minutes of continuous aerobic without signs/symptoms of physical distress      Intensity   THRR 40-80% of Max Heartrate 96-135    Ratings of Perceived Exertion 11-13    Perceived Dyspnea 0-4      Progression   Progression Continue to progress workloads to maintain intensity without signs/symptoms of physical distress.      Resistance Training   Training Prescription Yes    Weight 3 lb    Reps 10-15           Perform Capillary Blood Glucose checks as needed.  Exercise Prescription Changes:  Exercise Prescription Changes    Row Name 04/10/20 1600 04/16/20 1300 04/30/20 0900 05/12/20 1500 05/13/20 0900     Response to Exercise   Blood Pressure (Admit) 118/70 138/72 118/70 -- 138/80   Blood Pressure (Exercise) 142/74 158/76 142/70 -- 196/84   Blood Pressure (Exit) 120/68 130/70 122/68 -- 136/76   Heart Rate (Admit) 58 bpm 57  bpm 77 bpm -- 85 bpm   Heart Rate (Exercise) 85 bpm 100 bpm 125 bpm -- 113 bpm   Heart Rate (Exit) 63 bpm 71 bpm 86 bpm -- 85 bpm   Oxygen Saturation (Admit) 97 % -- -- -- --   Oxygen Saturation (Exercise) 98 % -- -- -- --   Oxygen Saturation (Exit) 99 % -- -- -- --   Rating of Perceived Exertion (Exercise) _0 -- 12   Perceived Dyspnea (Exercise) 0 -- -- -- --   Symptoms none none none -- none   Comments walk test results -- -- -- --   Duration Progress to 30 minutes of  aerobic without signs/symptoms of physical distress -- Continue with 30 min of aerobic exercise without signs/symptoms of physical distress. -- Continue with 30 min of aerobic exercise without signs/symptoms of physical distress.   Intensity -- -- THRR unchanged -- THRR unchanged     Progression   Progression -- -- Continue to progress workloads to maintain intensity without signs/symptoms of physical distress. -- Continue to progress workloads to maintain intensity without signs/symptoms of physical distress.   Average METs -- -- 2.82 -- 3     Resistance Training   Training Prescription -- -- Yes -- Yes   Weight -- -- 4 lb -- 4 lb   Reps -- -- 10-15 -- 10-15     Interval Training   Interval Training -- -- No -- --     Treadmill   MPH -- -- 2.5 -- 3   Grade -- -- 0.5 -- 1   Minutes -- -- 15 -- 15   METs -- -- 3.09 -- 3.7     NuStep   Level -- -- 3 -- --   Minutes -- -- 15 -- --   METs -- -- 3 -- --  Elliptical   Level -- -- 1 -- --   Speed -- -- 2.5 -- --   Minutes -- -- 15 -- --     REL-XR   Level -- -- 3 -- 3   Speed -- -- -- -- 50   Minutes -- -- 15 -- 15   METs -- -- 2.2 -- --     Biostep-RELP   Level -- -- 3 -- --   Minutes -- -- 15 -- --   METs -- -- 3 -- --     Home Exercise Plan   Plans to continue exercise at -- -- -- Home (comment)  walking --   Frequency -- -- -- Add 2 additional days to program exercise sessions.  Start with adding 1 day/week, then increase to 2 --   Initial  Home Exercises Provided -- -- -- 05/12/20 --   [place] Name 05/28/20 1400 06/11/20 1300 06/25/20 1300         Response to Exercise   Blood Pressure (Admit) 122/60 126/72 140/72     Blood Pressure (Exercise) 176/72 176/74 178/80     Blood Pressure (Exit) 124/68 124/68 132/68     Heart Rate (Admit) 71 bpm 80 bpm 75 bpm     Heart Rate (Exercise) 101 bpm 113 bpm 113 bpm     Heart Rate (Exit) 81 bpm 79 bpm 83 bpm     Rating of Perceived Exertion (Exercise) _0 Symptoms none none none     Duration Continue with 30 min of aerobic exercise without signs/symptoms of physical distress. Continue with 30 min of aerobic exercise without signs/symptoms of physical distress. Continue with 30 min of aerobic exercise without signs/symptoms of physical distress.     Intensity THRR unchanged THRR unchanged THRR unchanged           Progression   Progression Continue to progress workloads to maintain intensity without signs/symptoms of physical distress. Continue to progress workloads to maintain intensity without signs/symptoms of physical distress. Continue to progress workloads to maintain intensity without signs/symptoms of physical distress.     Average METs 3.53 3.6 3.1           Resistance Training   Training Prescription Yes Yes Yes     Weight 5 lb 6 lb 6 lb     Reps 10-15 10-15 10-15           Interval Training   Interval Training No No No           Treadmill   MPH 2._1 Grade 0._2 Minutes _3 METs 3.42 3.7 3.7           NuStep   Level 5 -- --     Minutes 15 -- --     METs 3.4 -- --           Elliptical   Level -- -- 1     Minutes -- -- 15     METs -- -- 2.5           REL-XR   Level 2 5 --     Speed -- 50 --     Minutes 15 15 --     METs 2.3 3.4 --           Biostep-RELP   Level 5 -- --     Minutes 15 -- --  METs 5 -- --           Home Exercise Plan   Plans to continue exercise at Home (comment)  walking Home (comment)  walking --      Frequency Add 2 additional days to program exercise sessions.  Start with adding 1 day/week, then increase to 2 Add 2 additional days to program exercise sessions.  Start with adding 1 day/week, then increase to 2 --     Initial Home Exercises Provided 05/12/20 05/12/20 --            Exercise Comments:   Exercise Goals and Review:  Exercise Goals    Row Name 04/10/20 1636             Exercise Goals   Increase Physical Activity Yes       Intervention Provide advice, education, support and counseling about physical activity/exercise needs.;Develop an individualized exercise prescription for aerobic and resistive training based on initial evaluation findings, risk stratification, comorbidities and participant's personal goals.       Expected Outcomes Short Term: Attend rehab on a regular basis to increase amount of physical activity.;Long Term: Add in home exercise to make exercise part of routine and to increase amount of physical activity.;Long Term: Exercising regularly at least 3-5 days a week.       Increase Strength and Stamina Yes       Intervention Provide advice, education, support and counseling about physical activity/exercise needs.;Develop an individualized exercise prescription for aerobic and resistive training based on initial evaluation findings, risk stratification, comorbidities and participant's personal goals.       Expected Outcomes Short Term: Increase workloads from initial exercise prescription for resistance, speed, and METs.;Short Term: Perform resistance training exercises routinely during rehab and add in resistance training at home;Long Term: Improve cardiorespiratory fitness, muscular endurance and strength as measured by increased METs and functional capacity (6MWT)       Able to understand and use rate of perceived exertion (RPE) scale Yes       Intervention Provide education and explanation on how to use RPE scale       Expected Outcomes Short Term: Able  to use RPE daily in rehab to express subjective intensity level;Long Term:  Able to use RPE to guide intensity level when exercising independently       Able to understand and use Dyspnea scale Yes       Intervention Provide education and explanation on how to use Dyspnea scale       Expected Outcomes Short Term: Able to use Dyspnea scale daily in rehab to express subjective sense of shortness of breath during exertion;Long Term: Able to use Dyspnea scale to guide intensity level when exercising independently       Knowledge and understanding of Target Heart Rate Range (THRR) Yes       Intervention Provide education and explanation of THRR including how the numbers were predicted and where they are located for reference       Expected Outcomes Short Term: Able to state/look up THRR;Short Term: Able to use daily as guideline for intensity in rehab;Long Term: Able to use THRR to govern intensity when exercising independently       Able to check pulse independently Yes       Intervention Provide education and demonstration on how to check pulse in carotid and radial arteries.;Review the importance of being able to check your own pulse for safety during independent exercise  Expected Outcomes Short Term: Able to explain why pulse checking is important during independent exercise;Long Term: Able to check pulse independently and accurately       Understanding of Exercise Prescription Yes       Intervention Provide education, explanation, and written materials on patient's individual exercise prescription       Expected Outcomes Short Term: Able to explain program exercise prescription;Long Term: Able to explain home exercise prescription to exercise independently              Exercise Goals Re-Evaluation :  Exercise Goals Re-Evaluation    Row Name 04/14/20 1400 04/30/20 0931 05/12/20 1514 05/13/20 0908 05/28/20 1425     Exercise Goal Re-Evaluation   Exercise Goals Review Increase Physical  Activity;Able to understand and use rate of perceived exertion (RPE) scale;Knowledge and understanding of Target Heart Rate Range (THRR);Understanding of Exercise Prescription;Increase Strength and Stamina;Able to check pulse independently Increase Physical Activity;Increase Strength and Stamina;Understanding of Exercise Prescription Increase Physical Activity;Increase Strength and Stamina;Understanding of Exercise Prescription Increase Physical Activity;Increase Strength and Stamina;Understanding of Exercise Prescription Increase Physical Activity;Increase Strength and Stamina;Understanding of Exercise Prescription   Comments Reviewed PLB technique with pt.  Talked about how it works and it's importance in maintaining their exercise saturations. [name] is off to a good start in rehab.  He has already increased his handweights to 4 lb.  We will continue to monitor his progress. Reviewed home exercise with pt today.  Pt plans to walk for exercise. 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 Talked about starting off with smaller bouts of exercise (~10 minutes at a time) 3x/ day and slowly increasing the duration up to the complete 30 minutes of aerobic exercise at once. Will start off with adding 1 extra day/ week and increasing that thereafter. He also stated he is thinking about purchasing equipment to keep at his house. Reviewed THR, pulse, RPE, sign and symptoms, pulse oximetery and when to call 911 or MD.  Also discussed weather considerations and indoor options.  Pt voiced understanding. [name] is making steady progress with exercise.  Staff will monitor progress. [name] is doing well in rehab.  He is now up to 2.9 mph on the treadmill and using 5lb hand weights.  We will continue to monitor his progress.   Expected Outcomes Short: Become more profiecient at using PLB.   Long: Become independent at using PLB. Short: Increase NuStep and add incline  to treadmill  Long: Continue to improve stamina. Short: Start adding in 1 day of exercise at home Long: Able to exercise independently at home with no complications Short: continue to exercise consistently Long:  increase overal MET level Short: Increase level on XR  Long; Conitnue to improve stamina   Row Name 06/02/20 1428 06/11/20 1309 06/25/20 1304 06/25/20 1305 07/02/20 1432     Exercise Goal Re-Evaluation   Exercise Goals Review Increase Physical Activity;Increase Strength and Stamina;Understanding of Exercise Prescription Increase Physical Activity;Increase Strength and Stamina;Understanding of Exercise Prescription Increase Physical Activity;Increase Strength and Stamina -- Increase Physical Activity;Increase Strength and Stamina   Comments [name] is doing well in rehab.  He is feeling better overall.  He is walking for about 30 min on his off days and doing weights too.  He does feel that his strength and stamina are getting better. [name] is progressing well and is up to level 5 on XR and up  to 6 lb on weights.  Staff will monitor progress. Tim has 6 sessions left. Staff expects he will improve his post 6MWT. Tim did improve post 6MWT and he can tell he has more energy than before.  He feels better about himself whe he exercises.  He says he better understands what to do exercising.   Expected Outcomes Short: Continue to walk on off days Long: Continue to improve stamina. Short:  continue to progress workloads Long: increase overall MET level -- Short: improve post 6MWT Long:  complete HT program Short:  complete HT program Long: maintain exercise on his own          Discharge Exercise Prescription (Final Exercise Prescription Changes):  Exercise Prescription Changes - 06/25/20 1300      Response to Exercise   Blood Pressure (Admit) 140/72    Blood Pressure (Exercise) 178/80    Blood Pressure (Exit) 132/68    Heart Rate (Admit) 75 bpm    Heart Rate (Exercise) 113 bpm    Heart Rate (Exit) 83  bpm    Rating of Perceived Exertion (Exercise) 15    Symptoms none    Duration Continue with 30 min of aerobic exercise without signs/symptoms of physical distress.    Intensity THRR unchanged      Progression   Progression Continue to progress workloads to maintain intensity without signs/symptoms of physical distress.    Average METs 3.1      Resistance Training   Training Prescription Yes    Weight 6 lb    Reps 10-15      Interval Training   Interval Training No      Treadmill   MPH 3    Grade 1    Minutes 15    METs 3.7      Elliptical   Level 1    Minutes 15    METs 2.5           Nutrition:  Target Goals: Understanding of nutrition guidelines, daily intake of sodium <1536m, cholesterol <2066m calories 30% from fat and 7% or less from saturated fats, daily to have 5 or more servings of fruits and vegetables.  Education: All About Nutrition: -Group instruction provided by verbal, written material, interactive activities, discussions, models, and posters to present general guidelines for heart healthy nutrition including fat, fiber, MyPlate, the role of sodium in heart healthy nutrition, utilization of the nutrition label, and utilization of this knowledge for meal planning. Follow up email sent as well. Written material given at graduation. [facility]  Date 06/04/20  [facility] Code 1- Verbalizes Understanding      Biometrics:  Pre Biometrics - 04/10/20 1636      Pre Biometrics   Height 5' 8.2" (1.732 m)    Weight 199 lb (90.3 kg)    BMI (Calculated) 30.09    Single Leg Stand 30 seconds           Post Biometrics - 06/11/20 1428       Post  Biometrics   Height 5' 8.2" (1.732 m)    Weight 199 lb 3.2 oz (90.4 kg)    BMI (Calculated) 30.12           Nutrition Therapy Plan and Nutrition Goals:  Nutrition Therapy & Goals - 04/23/20 1333      Nutrition Therapy    Diet Heart healthy, Low Na    Drug/Food Interactions Statins/Certain Fruits  Protein (specify units) 75g    Fiber 30 grams    Whole Grain Foods 3 servings    Saturated Fats 12 max. grams    Fruits and Vegetables 5 servings/day    Sodium 1.5 grams      Personal Nutrition Goals   Nutrition Goal ST: increase vegetable intake and increase variety LT: Get stronger, feels like he is getting weaker and weaker. 7/10 right now    Comments Activity level has decreases. He feels like he is having muscle soreness. He will have a biscuit from [employer] for breakfast on the road. B: cheerios with banana and almond milk, restaurant breakfast, protein shake. L: meat with starch and vegetables D: same as lunch. Eats mostly red meat but will sometimes have chicken, fish and prok chops. Reports the vegetables he normally eats are potatoes and corn. Discussed variety and MyPlate. Discuseed heart healhty eating. Coffee with sweetener - asked about artificial sweeteners - he has GI distress with them, discussed how a moderate amount of sugar for his coffee can fit into a heart healthy diet - he does not like stevia.      Intervention Plan   Intervention Prescribe, educate and counsel regarding individualized specific dietary modifications aiming towards targeted core components such as weight, hypertension, lipid management, diabetes, heart failure and other comorbidities.;Nutrition handout(s) given to patient.    Expected Outcomes Short Term Goal: Understand basic principles of dietary content, such as calories, fat, sodium, cholesterol and nutrients.;Short Term Goal: A plan has been developed with personal nutrition goals set during dietitian appointment.;Long Term Goal: Adherence to prescribed nutrition plan.           Nutrition Assessments:  Nutrition Assessments - 06/16/20 1438      MEDFICTS Scores   Post Score 76          MEDIFICTS Score Key:  ?70 Need to make dietary changes   40-70 Heart  Healthy Diet  ? 40 Therapeutic Level Cholesterol Diet   Picture Your Plate Scores:  <53 Unhealthy dietary pattern with much room for improvement.  41-50 Dietary pattern unlikely to meet recommendations for good health and room for improvement.  51-60 More healthful dietary pattern, with some room for improvement.   >60 Healthy dietary pattern, although there may be some specific behaviors that could be improved.    Nutrition Goals Re-Evaluation:  Nutrition Goals Re-Evaluation    [place] Name 05/14/20 1429 06/02/20 1432 07/02/20 1435         Goals   Nutrition Goal ST: Continue increase vegetable intake and increase variety, replace red meat 2x/week with fish (shrimp and salmon or tuna and beans) LT: Get stronger, feels like he is getting weaker and weaker. 7/10 right now ST: Continue increase vegetable intake and increase variety, replace red meat 2x/week with fish (shrimp and salmon or tuna and beans) LT: Get stronger, feels like he is getting weaker and weaker. 7/10 right now --     Comment Tim reports have added squash, zucchini, and carrots to dinner meal. Discussed Replacing red meat with another more heart healthy protein - gave some ideas for salmon as pt reports not knowing how to cook that. continue to add variety to vegetables. [name] is doing well with his diet.  He is trying to add in more vegetables and he is starting to like them a little more.  He knows he is going to cheat some this weekend, but will get back to it. [name] is feeling stronger now.  He has  switched out red meat for more fish.  He also is eating more vegetables.     Expected Outcome ST: Continue increase vegetable intake and increase variety, replace red meat 2x/week with fish (shrimp and salmon or tuna and beans) EP:PIRJJO red meat to 1-2x/week, eat out <3x/week, get a variety of fruits and vegetables. Short: Continue to add in vegetables  Long; Balance diet. Short: continue to have vegetables and fish Long: maintain  heart healthy diet            Nutrition Goals Discharge (Final Nutrition Goals Re-Evaluation):  Nutrition Goals Re-Evaluation - 07/02/20 1435      Goals   Comment Tim is feeling stronger now.  He has switched out red meat for more fish.  He also is eating more vegetables.    Expected Outcome Short: continue to have vegetables and fish Long: maintain heart healthy diet           Psychosocial: Target Goals: Acknowledge presence or absence of significant depression and/or stress, maximize coping skills, provide positive support system. Participant is able to verbalize types and ability to use techniques and skills needed for reducing stress and depression.   Education: Stress, Anxiety, and Depression - Group verbal and visual presentation to define topics covered.  Reviews how body is impacted by stress, anxiety, and depression.  Also discusses healthy ways to reduce stress and to treat/manage anxiety and depression.  Written material given at graduation. Flowsheet Row Cardiac Rehab from 06/25/2020 in [facility] Cardiac and Pulmonary Rehab  Date 04/30/20  Educator [facility]  Instruction Review Code 1- [employer] Understanding      Education: Sleep Hygiene -Provides group verbal and written instruction about how sleep can affect your health.  Define sleep hygiene, discuss sleep cycles and impact of sleep habits. Review good sleep hygiene tips.    Initial Review & Psychosocial Screening:  Initial Psych Review & Screening - 04/08/20 0832      Initial Review   Current issues with None Identified      Family Dynamics   Good Support System? Yes    Comments He can look to his wife, daugher and family for support. He has a positive outlook on his health.      Barriers   Psychosocial barriers to participate in program There are no identifiable barriers or psychosocial needs.      Screening Interventions   Interventions Encouraged to exercise;Provide feedback about the scores to participant;To  provide support and resources with identified psychosocial needs    Expected Outcomes Short Term goal: Utilizing psychosocial counselor, staff and physician to assist with identification of specific Stressors or current issues interfering with healing process. Setting desired goal for each stressor or current issue identified.;Long Term Goal: Stressors or current issues are controlled or eliminated.;Short Term goal: Identification and review with participant of any Quality of Life or Depression concerns found by scoring the questionnaire.;Long Term goal: The participant improves quality of Life and PHQ9 Scores as seen by post scores and/or verbalization of changes           Quality of Life Scores:   Quality of Life - 06/16/20 1441      Quality of Life Scores   Health/Function Post 24 %    Socioeconomic Post 28.44 %    Psych/Spiritual Post 24.86 %    Family Post 28.8 %    GLOBAL Post 25.87 %          Scores of 19 and below usually indicate a poorer quality  of life in these areas.  A difference of  2-3 points is a clinically meaningful difference.  A difference of 2-3 points in the total score of the Quality of Life Index has been associated with significant improvement in overall quality of life, self-image, physical symptoms, and general health in studies assessing change in quality of life.  PHQ-9: Recent Review Flowsheet Data    Depression screen [facility] 2/9 06/16/2020 04/10/2020   Decreased Interest 0 1   Down, Depressed, Hopeless 0 1   PHQ - 2 Score 0 2   Altered sleeping 0 0   Tired, decreased energy 1 0   Change in appetite 0 0   Feeling bad or failure about yourself  0 0   Trouble concentrating 1 0   Moving slowly or fidgety/restless 0 0   Suicidal thoughts 0 0   PHQ-9 Score 2 2   Difficult doing work/chores Not difficult at all Not difficult at all     Interpretation of Total Score  Total Score Depression Severity:  1-4 = Minimal depression, 5-9 = Mild depression, 10-14 =  Moderate depression, 15-19 = Moderately severe depression, 20-27 = Severe depression   Psychosocial Evaluation and Intervention:  Psychosocial Evaluation - 04/08/20 0833      Psychosocial Evaluation & Interventions   Interventions Encouraged to exercise with the program and follow exercise prescription    Comments He can look to his wife, daugher and family for support. He has a positive outlook on his health.    Expected Outcomes Short: Exercise regularly to support mental health and notify staff of any changes. Long: maintain mental health and well being through teaching of rehab or prescribed medications independently.    Continue Psychosocial Services  Follow up required by staff           Psychosocial Re-Evaluation:  Psychosocial Re-Evaluation    [place] Name 05/14/20 1435 05/14/20 1523 05/14/20 1526 06/02/20 1429 07/02/20 1437     Psychosocial Re-Evaluation   Current issues with -- Current Stress Concerns -- Current Stress Concerns Current Stress Concerns   Comments -- [name] is doing really well mentally. He states that he sleeps well at night. 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 He is due for his physical for truck driving in December and is really aiming to have stable blood pressures by then. He inquired about using meditation or breathing exercises to help lower his blood pressure as he sometimes feels anxious when he goes in for his physical test. Discussed using calm.com for breathing and meditation and patient is willing and excited to try it. -- [name] is doing well in rehab.  They are getting ready to go to [place] this weekend for a Christmas present as a family trip.  They have been planning this trip for over a year!  He denies any major stessors currently. He sleeps good.  Their schedule is their biggest stressor and he is thinking of picking up an extra day in rehab. Tim denies any major stress.  He sleeps  well.  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   Expected Outcomes -- Short: Practice using [email or web address] exercises Long: Continue to maintain positive attitude -- Short: Enjoy vacation Long: Continue to stay positive. Short: complete HT Long: maintain positive outlook   Interventions -- Encouraged to attend Cardiac Rehabilitation for the exercise -- Encouraged to attend Cardiac Rehabilitation for the  exercise --   Continue Psychosocial Services  -- Follow up required by staff -- -- --          Psychosocial Discharge (Final Psychosocial Re-Evaluation):  Psychosocial Re-Evaluation - 07/02/20 1437      Psychosocial Re-Evaluation   Current issues with Current Stress Concerns    Comments Tim denies any major stress.  He sleeps well.  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    Expected Outcomes Short: complete HT Long: maintain positive outlook           Vocational Rehabilitation: Provide vocational rehab assistance to qualifying candidates.   Vocational Rehab Evaluation & Intervention:   Education: Education Goals: Education classes will be provided on a variety of topics geared toward better understanding of heart health and risk factor modification. Participant will state understanding/return demonstration of topics presented as noted by education test scores.  Learning Barriers/Preferences:  Learning Barriers/Preferences - 04/08/20 0831      Learning Barriers/Preferences   Learning Barriers None    Learning Preferences None           General Cardiac Education Topics:  AED/CPR: - Group verbal and written instruction with the use of models to demonstrate the basic use of the AED with the basic ABC's of resuscitation.   Anatomy and Cardiac Procedures: - Group verbal and visual presentation and models provide information about basic cardiac anatomy and function. Reviews the testing methods done to diagnose heart disease and the outcomes of the test  results. Describes the treatment choices: Medical Management, Angioplasty, or Coronary Bypass Surgery for treating various heart conditions including Myocardial Infarction, Angina, Valve Disease, and Cardiac Arrhythmias.  Written material given at graduation. Flowsheet Row Cardiac Rehab from 06/25/2020 in [facility] Cardiac and Pulmonary Rehab  Date 05/21/20  Educator [facility]  Instruction Review Code 1- Verbalizes Understanding      Medication Safety: - Group verbal and visual instruction to review commonly prescribed medications for heart and lung disease. Reviews the medication, class of the drug, and side effects. Includes the steps to properly store meds and maintain the prescription regimen.  Written material given at graduation. Flowsheet Row Cardiac Rehab from 06/25/2020 in [facility] Cardiac and Pulmonary Rehab  Date 06/11/20  Educator [facility]  Instruction Review Code 1- Verbalizes Understanding      Intimacy: - Group verbal instruction through game format to discuss how heart and lung disease can affect sexual intimacy. Written material given at graduation.. Flowsheet Row Cardiac Rehab from 06/25/2020 in [facility] Cardiac and Pulmonary Rehab  Date 05/14/20  Educator [facility]  Instruction Review Code 1- Verbalizes Understanding      Know Your Numbers and Heart Failure: - Group verbal and visual instruction to discuss disease risk factors for cardiac and pulmonary disease and treatment options.  Reviews associated critical values for Overweight/Obesity, Hypertension, Cholesterol, and Diabetes.  Discusses basics of heart failure: signs/symptoms and treatments.  Introduces Heart Failure Zone chart for action plan for heart failure.  Written material given at graduation.   Infection Prevention: - Provides verbal and written material to individual with discussion of infection control including proper hand washing and proper equipment cleaning during exercise session. Flowsheet Row Cardiac Rehab from 06/25/2020 in [facility]  Cardiac and Pulmonary Rehab  Date 04/08/20  Educator [facility]  Instruction Review Code 1- Verbalizes Understanding      Falls Prevention: - Provides verbal and written material to individual with discussion of falls prevention and safety. Flowsheet Row Cardiac Rehab from 06/25/2020 in [facility]  Cardiac and Pulmonary Rehab  Date 04/08/20  Educator [facility]  Instruction Review Code 1- Verbalizes Understanding      Other: -Provides group and verbal instruction on various topics (see comments)   Knowledge Questionnaire Score:  Knowledge Questionnaire Score - 06/16/20 1440      Knowledge Questionnaire Score   Post Score 24/26: angina, exercise           Core Components/Risk Factors/Patient Goals at Admission:  Personal Goals and Risk Factors at Admission - 04/10/20 1637      Core Components/Risk Factors/Patient Goals on Admission    Weight Management Yes;Weight Loss    Intervention Weight Management: Develop a combined nutrition and exercise program designed to reach desired caloric intake, while maintaining appropriate intake of nutrient and fiber, sodium and fats, and appropriate energy expenditure required for the weight goal.;Weight Management: Provide education and appropriate resources to help participant work on and attain dietary goals.;Weight Management/Obesity: Establish reasonable short term and long term weight goals.    Admit Weight 199 lb (90.3 kg)    Goal Weight: Short Term 194 lb (88 kg)    Goal Weight: Long Term 189 lb (85.7 kg)    Expected Outcomes Short Term: Continue to assess and modify interventions until short term weight is achieved;Long Term: Adherence to nutrition and physical activity/exercise program aimed toward attainment of established weight goal;Weight Loss: Understanding of general recommendations for a balanced deficit meal plan, which promotes 1-2 lb weight loss per week and includes a negative energy balance of [phone] kcal/d;Understanding recommendations for  meals to include 15-35% energy as protein, 25-35% energy from fat, 35-60% energy from carbohydrates, less than 251m of dietary cholesterol, 20-35 gm of total fiber daily;Understanding of distribution of calorie intake throughout the day with the consumption of 4-5 meals/snacks    Hypertension Yes    Intervention Monitor prescription use compliance.;Provide education on lifestyle modifcations including regular physical activity/exercise, weight management, moderate sodium restriction and increased consumption of fresh fruit, vegetables, and low fat dairy, alcohol moderation, and smoking cessation.    Expected Outcomes Short Term: Continued assessment and intervention until BP is < 140/971mHG in hypertensive participants. < 130/8057mG in hypertensive participants with diabetes, heart failure or chronic kidney disease.;Long Term: Maintenance of blood pressure at goal levels.    Lipids Yes    Intervention Provide education and support for participant on nutrition & aerobic/resistive exercise along with prescribed medications to achieve LDL <19m37mDL >40mg76m Expected Outcomes Short Term: Participant states understanding of desired cholesterol values and is compliant with medications prescribed. Participant is following exercise prescription and nutrition guidelines.;Long Term: Cholesterol controlled with medications as prescribed, with individualized exercise RX and with personalized nutrition plan. Value goals: LDL < 19mg,79m > 40 mg.           Education:Diabetes - Individual verbal and written instruction to review signs/symptoms of diabetes, desired ranges of glucose level fasting, after meals and with exercise. Acknowledge that pre and post exercise glucose checks will be done for 3 sessions at entry of program.   Core Components/Risk Factors/Patient Goals Review:   Goals and Risk Factor Review    Row Name 05/14/20 1518 06/02/20 1433 07/02/20 1428         Core Components/Risk  Factors/Patient Goals Review   Personal Goals Review Weight Management/Obesity;Hypertension;Lipids Weight Management/Obesity;Hypertension;Lipids Weight Management/Obesity;Hypertension;Lipids     Review Tim isOctavia Brucknering well. He continues to weigh himself at home and reports his weight remains stable within 1-2 pounds. He  would like to lose weight, and is really focusing on eatier healthier. He recently spoke with the RD today to set new nutrition goals. He also plans to exercise more at home to also help. Has been checking his BP at home and stays around 409W systolic and 11B diastolic. 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 Talked about keeping a log of his blood pressures to monitor closely. Remains compliance with medications. [name] is doing well in rehab.  His weight has been stable.  He is trying to push as harder at home. We talked about using the staff videos and maybe adding in intervals in class to help lose weight.  He checks his pressures regularly and they have gotten better recently.  He is still working and wants to finish up rehab. [name] has tried some of the staff videos.  His weight is still stable.  He can tell his clothes fit better and he has more energy than before.  He does check BP at home and say they are high once in a while.  He has a physical coming up soon when he will get lipids checked.  He has cut out sweets,  bread and having smaller portions to lose weight.     Expected Outcomes Short: Keep consisent log of BPs Long: Continue to manage lifestyle risk factors Short: Continue to work on [employer] Long: Continue to monitor risk factors Short:  continue to eat smaller portions and avoid sweets Long: manage risk factors on his own            Core Components/Risk Factors/Patient Goals at Discharge (Final Review):   Goals and Risk Factor Review - 07/02/20 1428      Core Components/Risk Factors/Patient Goals Review   Personal Goals Review Weight  Management/Obesity;Hypertension;Lipids    Review [name] has tried some of the staff videos.  His weight is still stable.  He can tell his clothes fit better and he has more energy than before.  He does check BP at home and say they are high once in a while.  He has a physical coming up soon when he will get lipids checked.  He has cut out sweets,  bread and having smaller portions to lose weight.    Expected Outcomes Short:  continue to eat smaller portions and avoid sweets Long: manage risk factors on his own           ITP Comments:  ITP Comments    Row Name 04/08/20 0855 04/10/20 1855 04/14/20 1359 04/30/20 0516 05/28/20 0609   ITP Comments Virtual Visit completed. Patient informed on EP and RD appointment and 6 Minute walk test. Patient also informed of patient health questionnaires on My Chart. Patient Verbalizes understanding. Visit diagnosis can be found in [facility] 12/27/2019. Completed 6MWT and gym orientation. Initial ITP created and sent for review to Dr. Emily Filbert, Medical Director. First full day of exercise!  Patient was oriented to gym and equipment including functions, settings, policies, and procedures.  Patient's individual exercise prescription and treatment plan were reviewed.  All starting workloads were established based on the results of the 6 minute walk test done at initial orientation visit.  The plan for exercise progression was also introduced and progression will be customized based on patient's performance and goals. 30 Day review completed. Medical Director ITP review done, changes made as directed, and signed approval by Medical Director. 30 Day review completed. Medical Director ITP review done,  changes made as directed, and signed approval by Medical Director.   [place] Name 06/25/20 0909 07/09/20 1423         ITP Comments 30 Day review completed. Medical Director ITP review done, changes made as directed, and signed approval by Medical Director. Hendrix graduated today from   rehab with 36 sessions completed.  Details of the patient's exercise prescription and what He needs to do in order to continue the prescription and progress were discussed with patient.  Patient was given a copy of prescription and goals.  Patient verbalized understanding.  Jeson plans to continue to exercise by walking.             Comments: Discharge ITP

## 2020-07-09 NOTE — Progress Notes (Signed)
Daily Session Note  Patient Details  Name: Richard Webster MRN: [id number] Date of Birth: 12-Dec-1955 Referring Provider:   Flowsheet Row Cardiac Rehab from 04/10/2020 in [facility] Cardiac and Pulmonary Rehab  Referring Provider Dr. Lujean Amel      Encounter Date: 07/09/2020  Check In:  Session Check In - 07/09/20 1422      Check-In   Supervising physician immediately available to respond to emergencies See telemetry face sheet for immediately available ER MD    Location [facility]-Cardiac & Pulmonary Rehab    Staff Present Justin Mend RCP,RRT,BSRT;Candra Wegner Sherryll Burger, RN Margurite Auerbach, MS Exercise Physiologist    Virtual Visit No    Medication changes reported     No    Fall or balance concerns reported    No    Warm-up and Cool-down Performed on first and last piece of equipment    Resistance Training Performed Yes    VAD Patient? No    PAD/SET Patient? No      Pain Assessment   Currently in Pain? No/denies              Social History   Tobacco Use  Smoking Status Former Smoker  . Packs/day: 0.50  . Years: 15.00  . Pack years: 7.50  . Types: Cigarettes  . Quit date: 09/04/1983  . Years since quitting: 36.8  Smokeless Tobacco Former Systems developer    Goals Met:  Independence with exercise equipment Exercise tolerated well No report of cardiac concerns or symptoms Strength training completed today  Goals Unmet:  Not Applicable  Comments:  Jaivion graduated today from  rehab with 36 sessions completed.  Details of the patient's exercise prescription and what He needs to do in order to continue the prescription and progress were discussed with patient.  Patient was given a copy of prescription and goals.  Patient verbalized understanding.  Jayziah plans to continue to exercise by walking.     Dr. Emily Filbert is Medical Director for [facility].

## 2020-08-25 ENCOUNTER — Telehealth: Payer: Self-pay

## 2020-08-25 MED ORDER — TACROLIMUS 0.1 % EX OINT: TOPICAL_OINTMENT | CUTANEOUS | 3 refills | Status: DC

## 2020-08-25 NOTE — Telephone Encounter (Signed)
Pt wife called to have Tacrolimus cream rx sent to mail in [place], okay erx'd Tacrolimus to [facility]

## 2020-08-26 ENCOUNTER — Other Ambulatory Visit: Payer: Self-pay

## 2020-08-26 MED ORDER — TACROLIMUS 0.1 % EX OINT: TOPICAL_OINTMENT | CUTANEOUS | 3 refills | Status: DC

## 2020-08-26 NOTE — Progress Notes (Signed)
[facility] requested clarification on directions.

## 2020-09-29 ENCOUNTER — Ambulatory Visit: Payer: Medicare Other | Admitting: Dermatology

## 2020-10-03 ENCOUNTER — Encounter: Payer: Self-pay | Admitting: Urology

## 2020-10-03 ENCOUNTER — Other Ambulatory Visit: Payer: Self-pay

## 2020-10-03 ENCOUNTER — Ambulatory Visit: Payer: Medicare Other | Admitting: Urology

## 2020-10-03 VITALS — BP 107/66 | HR 57 | Ht 67.0 in | Wt 200.0 lb

## 2020-10-03 DIAGNOSIS — Z87442 Personal history of urinary calculi: Secondary | ICD-10-CM | POA: Diagnosis not present

## 2020-10-03 DIAGNOSIS — R3129 Other microscopic hematuria: Secondary | ICD-10-CM | POA: Diagnosis not present

## 2020-10-03 LAB — URINALYSIS, COMPLETE
Bilirubin, UA: NEGATIVE
Glucose, UA: NEGATIVE
Ketones, UA: NEGATIVE
Leukocytes,UA: NEGATIVE
Nitrite, UA: NEGATIVE
Protein,UA: NEGATIVE
Specific Gravity, UA: 1.02 (ref 1.005–1.030)
Urobilinogen, Ur: 0.2 mg/dL (ref 0.2–1.0)
pH, UA: 5.5 (ref 5.0–7.5)

## 2020-10-03 LAB — MICROSCOPIC EXAMINATION
Bacteria, UA: NONE SEEN
RBC, Urine: NONE SEEN /hpf (ref 0–2)
WBC, UA: NONE SEEN /hpf (ref 0–5)

## 2020-10-03 NOTE — Progress Notes (Signed)
10/03/2020 1:06 PM   Richard Webster [birth year] [id number]  Referring provider: Rusty Aus, MD [facility] [place],  [place] [address]  Chief Complaint  Patient presents with  . Nephrolithiasis    HPI: 65 y.o. male presents for evaluation of microhematuria   UA Dr. Ammie Ferrier office 09/25/2020 with 10-50 RBCs  Denies gross hematuria  No bothersome LUTS or dysuria  Denies flank, abdominal or pelvic pain  History recurrent stone disease status post ureteroscopic removal of an 11 mm left renal pelvic calculus by Dr. Jacqlyn Larsen 09/2017  2 prior SWL   PMH: Past Medical History:  Diagnosis Date  . Anxiety    pt denies  . Arthritis   . Atypical chest pain   . BPH (benign prostatic hyperplasia)   . Chronic kidney disease    kidney stones  . H/O rheumatic heart disease   . Heart murmur   . History of right bundle branch block (RBBB)   . Hypertension   . Palpitations   . Prostatitis   . Rheumatic fever     Surgical History: Past Surgical History:  Procedure Laterality Date  . COLONOSCOPY    . LITHOTRIPSY     x2 for kidney stones  . POLYPECTOMY    . RIGHT/LEFT HEART CATH AND CORONARY ANGIOGRAPHY N/A 11/12/2019   Procedure: RIGHT/LEFT HEART CATH AND CORONARY ANGIOGRAPHY;  Surgeon: Yolonda Kida, MD;  Location: [facility];  Service: Cardiovascular;  Laterality: N/A;    Home Medications:  Allergies as of 10/03/2020   No Known Allergies     Medication List       Accurate as of October 03, 2020  1:06 PM. If you have any questions, ask your nurse or doctor.        aspirin EC 81 MG tablet Take 81 mg by mouth daily.   clopidogrel 75 MG tablet Commonly known as: PLAVIX Take by mouth.   etodolac 400 MG tablet Commonly known as: LODINE Take 400 mg by mouth daily.   naproxen sodium 220 MG tablet Commonly known as: ALEVE Take 220 mg by mouth daily as needed (pain).   nitroGLYCERIN 0.4 MG SL  tablet Commonly known as: NITROSTAT Place under the tongue.   olmesartan 20 MG tablet Commonly known as: BENICAR Take by mouth.   olmesartan-hydrochlorothiazide 20-12.5 MG tablet Commonly known as: BENICAR HCT Take 1 tablet by mouth daily.   omeprazole 40 MG capsule Commonly known as: PRILOSEC Take 40 mg by mouth daily before breakfast.   Opzelura 1.5 % Crea Generic drug: Ruxolitinib Phosphate Apply 1 application topically daily.   pantoprazole 20 MG tablet Commonly known as: PROTONIX Take 1 tablet by mouth daily.   rosuvastatin 20 MG tablet Commonly known as: CRESTOR Take by mouth.   tacrolimus 0.1 % ointment Commonly known as: PROTOPIC Apply 1-2 times as needed to affected area of rash on chest as directed.       Allergies: No Known Allergies  Family History: Family History  Problem Relation Age of Onset  . Stroke Mother   . Dementia Mother   . Colon cancer Neg Hx   . Rectal cancer Neg Hx   . Stomach cancer Neg Hx   . Colon polyps Neg Hx   . Esophageal cancer Neg Hx     Social History:  reports that he quit smoking about 37 years ago. His smoking use included cigarettes. He has a 7.50 pack-year smoking history. He has quit using smokeless  tobacco. He reports current alcohol use. He reports that he does not use drugs.   Physical Exam: BP 107/66   Pulse (!) 57   Ht 5\' 7"  (1.702 m)   Wt 200 lb (90.7 kg)   BMI 31.32 kg/m   Constitutional:  Alert and oriented, No acute distress. HEENT: [place] AT, moist mucus membranes.  Trachea midline, no masses. Cardiovascular: No clubbing, cyanosis, or edema. Respiratory: Normal respiratory effort, no increased work of breathing. Neurologic: Grossly intact, no focal deficits, moving all 4 extremities. Psychiatric: Normal mood and affect.  Laboratory Data:  Urinalysis Dipstick/microscopy negative   Assessment & Plan:    1.  Microhematuria  AUA risk stratification: High  Most likely etiology would be recurrent  stone  We discussed the standard recommend evaluation for high risk hematuria to include CT urogram and cystoscopy  If CT urogram shows obvious etiology he may not need cystoscopy  All questions were answered   Abbie Sons, [place] [address], [place], [place] [address] [phone]

## 2020-10-07 ENCOUNTER — Encounter: Payer: Self-pay | Admitting: Internal Medicine

## 2020-10-27 ENCOUNTER — Other Ambulatory Visit: Payer: Self-pay

## 2020-10-27 ENCOUNTER — Ambulatory Visit
Admission: RE | Admit: 2020-10-27 | Discharge: 2020-10-27 | Disposition: A | Discharge status: Home / Self Care | Payer: Medicare Other | Source: Ambulatory Visit | Attending: Urology | Admitting: Urology

## 2020-10-27 DIAGNOSIS — R3129 Other microscopic hematuria: Secondary | ICD-10-CM

## 2020-10-27 MED ORDER — IOHEXOL 300 MG/ML  SOLN
125.0000 mL | Freq: Once | INTRAMUSCULAR | Status: AC | PRN
  Administered 2020-10-27: 125 mL via INTRAVENOUS

## 2020-10-30 ENCOUNTER — Telehealth: Payer: Self-pay | Admitting: Family Medicine

## 2020-10-30 NOTE — Telephone Encounter (Signed)
Patient notified of the result. Patient voiced understanding and will come to the appointment.

## 2020-10-30 NOTE — Telephone Encounter (Signed)
-----   Message from Abbie Sons, MD sent at 10/29/2020  4:19 PM EDT ----- CT does show a nonobstructing stone in the left kidney.  This could be a possible cause of the blood in the urine though with the amount of blood he had would recommend he keep the cystoscopy appointment to rule out bladder pathology including bladder cancer.

## 2020-11-09 NOTE — Progress Notes (Signed)
11/10/2020   CC:  Chief Complaint  Patient presents with  . Cysto    HPI: Richard Webster is a 65 y.o. male who presents today for a cystoscopy and discussion of CT results.  CT abdomen and pelvis without and with contrast impression from 10/27/2020: 1. 7 x 6 x 9 mm nonobstructing stone lower pole left kidney. No secondary changes in either kidney or ureter. No other findings to explain the patient's history of microhematuria. 2. Aortic Atherosclerosis (ICD10-I70.0).  Today the patient has no complaints today   Blood pressure 130/70, pulse 72, height 5\' 7"  (1.702 m), weight 205 lb (93 kg). NED. A&Ox3.   No respiratory distress   Abd soft, NT, ND Normal phallus with bilateral descended testicles  Cystoscopy Procedure Note  Patient identification was confirmed, informed consent was obtained, and patient was prepped using Betadine solution.  Lidocaine jelly was administered per urethral meatus.     Pre-Procedure: - Inspection reveals a normal caliber urethral meatus.  Procedure: The flexible cystoscope was introduced without difficulty - No urethral strictures/lesions are present. - Lateral lobe enlargement with hypervascularity prostate  - Elevated bladder neck  - Bilateral ureteral orifices identified - Bladder mucosa  reveals no ulcers, tumors, or lesions - No bladder stones -Mild trabeculation  Retroflexion shows no abnormalities   Post-Procedure: - Patient tolerated the procedure well  Pertinent imaging:  Narrative & Impression  CLINICAL DATA:  Microhematuria.  EXAM: CT ABDOMEN AND PELVIS WITHOUT AND WITH CONTRAST  TECHNIQUE: Multidetector CT imaging of the abdomen and pelvis was performed following the standard protocol before and following the bolus administration of intravenous contrast.  CONTRAST:  111mL OMNIPAQUE IOHEXOL 300 MG/ML  SOLN  COMPARISON:  None.  FINDINGS: Lower chest: Unremarkable.  Hepatobiliary: No suspicious focal  abnormality within the liver parenchyma. There is no evidence for gallstones, gallbladder wall thickening, or pericholecystic fluid. No intrahepatic or extrahepatic biliary dilation.  Pancreas: No focal mass lesion. No dilatation of the main duct. No intraparenchymal cyst. No peripancreatic edema.  Spleen: No splenomegaly. No focal mass lesion.  Adrenals/Urinary Tract: No adrenal nodule or mass. Pre contrast imaging shows no stones in the right kidney or ureter. 7 x 6 x 9 mm nonobstructing stone identified lower pole left kidney with no left ureteral stone. No secondary changes are seen in either kidney or ureter. No bladder stones evident.  Imaging after IV contrast administration shows no suspicious enhancing abnormality in either kidney. Tiny hypodensities in each kidney are too small to characterize but most likely benign etiology such as cyst.  Delayed post-contrast imaging shows no wall thickening or soft tissue filling defect in either intrarenal collecting system or renal pelvis. Both ureters are well opacified without evidence for wall thickening, soft tissue lesion or focal dilatation. Delayed imaging of the bladder shows no focal wall thickening or mass lesion.  Stomach/Bowel: Stomach is unremarkable. No gastric wall thickening. No evidence of outlet obstruction. Duodenum is normally positioned as is the ligament of Treitz. No small bowel wall thickening. No small bowel dilatation. The terminal ileum is normal. The appendix is normal. No gross colonic mass. No colonic wall thickening.  Vascular/Lymphatic: There is abdominal aortic atherosclerosis without aneurysm. There is no gastrohepatic or hepatoduodenal ligament lymphadenopathy. No retroperitoneal or mesenteric lymphadenopathy. No pelvic sidewall lymphadenopathy.  Reproductive: The prostate gland and seminal vesicles are unremarkable.  Other: No intraperitoneal free fluid.  Musculoskeletal: No  worrisome lytic or sclerotic osseous abnormality.  IMPRESSION: 1. 7 x 6 x 9 mm nonobstructing  stone lower pole left kidney. No secondary changes in either kidney or ureter. No other findings to explain the patient's history of microhematuria. 2. Aortic Atherosclerosis (ICD10-I70.0).   Electronically Signed   By: Misty Stanley M.D.   On: 10/28/2020 10:55     Assessment/ Plan:  1. Nephrolithiasis  Nonobstructing left renal calculus  6 month follow-up with KUB/UA  2.  BPH with hypervascularity  Most likely cause of microhematuria   I, Ardyth Gal, am acting as a scribe for Dr. John Giovanni.   Ardyth Gal   I have reviewed the above documentation for accuracy and completeness, and I agree with the above.    Abbie Sons, MD

## 2020-11-10 ENCOUNTER — Other Ambulatory Visit: Payer: Self-pay

## 2020-11-10 ENCOUNTER — Ambulatory Visit (INDEPENDENT_AMBULATORY_CARE_PROVIDER_SITE_OTHER): Payer: Medicare Other | Admitting: Urology

## 2020-11-10 ENCOUNTER — Encounter: Payer: Self-pay | Admitting: Urology

## 2020-11-10 VITALS — BP 130/70 | HR 72 | Ht 67.0 in | Wt 205.0 lb

## 2020-11-10 DIAGNOSIS — N200 Calculus of kidney: Secondary | ICD-10-CM

## 2020-11-10 DIAGNOSIS — R3129 Other microscopic hematuria: Secondary | ICD-10-CM | POA: Diagnosis not present

## 2020-11-10 LAB — URINALYSIS, COMPLETE
Bilirubin, UA: NEGATIVE
Glucose, UA: NEGATIVE
Ketones, UA: NEGATIVE
Leukocytes,UA: NEGATIVE
Nitrite, UA: NEGATIVE
Protein,UA: NEGATIVE
RBC, UA: NEGATIVE
Specific Gravity, UA: 1.015 (ref 1.005–1.030)
Urobilinogen, Ur: 0.2 mg/dL (ref 0.2–1.0)
pH, UA: 6 (ref 5.0–7.5)

## 2020-11-10 LAB — MICROSCOPIC EXAMINATION: Bacteria, UA: NONE SEEN

## 2020-11-13 ENCOUNTER — Encounter: Payer: Self-pay | Admitting: Urology

## 2021-05-07 ENCOUNTER — Ambulatory Visit: Payer: Medicare Other | Admitting: Urology

## 2021-05-13 ENCOUNTER — Ambulatory Visit: Payer: Self-pay | Admitting: Urology

## 2021-08-05 DIAGNOSIS — M5416 Radiculopathy, lumbar region: Secondary | ICD-10-CM | POA: Diagnosis not present

## 2021-08-05 DIAGNOSIS — M9905 Segmental and somatic dysfunction of pelvic region: Secondary | ICD-10-CM | POA: Diagnosis not present

## 2021-08-05 DIAGNOSIS — M5136 Other intervertebral disc degeneration, lumbar region: Secondary | ICD-10-CM | POA: Diagnosis not present

## 2021-08-05 DIAGNOSIS — M9903 Segmental and somatic dysfunction of lumbar region: Secondary | ICD-10-CM | POA: Diagnosis not present

## 2021-08-06 DIAGNOSIS — M5136 Other intervertebral disc degeneration, lumbar region: Secondary | ICD-10-CM | POA: Diagnosis not present

## 2021-08-06 DIAGNOSIS — M9903 Segmental and somatic dysfunction of lumbar region: Secondary | ICD-10-CM | POA: Diagnosis not present

## 2021-08-06 DIAGNOSIS — M5416 Radiculopathy, lumbar region: Secondary | ICD-10-CM | POA: Diagnosis not present

## 2021-08-06 DIAGNOSIS — M9905 Segmental and somatic dysfunction of pelvic region: Secondary | ICD-10-CM | POA: Diagnosis not present

## 2021-08-10 DIAGNOSIS — I25118 Atherosclerotic heart disease of native coronary artery with other forms of angina pectoris: Secondary | ICD-10-CM | POA: Diagnosis not present

## 2021-08-10 DIAGNOSIS — E782 Mixed hyperlipidemia: Secondary | ICD-10-CM | POA: Diagnosis not present

## 2021-08-10 DIAGNOSIS — Z955 Presence of coronary angioplasty implant and graft: Secondary | ICD-10-CM | POA: Diagnosis not present

## 2021-08-10 DIAGNOSIS — I10 Essential (primary) hypertension: Secondary | ICD-10-CM | POA: Diagnosis not present

## 2021-08-10 DIAGNOSIS — M5136 Other intervertebral disc degeneration, lumbar region: Secondary | ICD-10-CM | POA: Diagnosis not present

## 2021-08-10 DIAGNOSIS — M9905 Segmental and somatic dysfunction of pelvic region: Secondary | ICD-10-CM | POA: Diagnosis not present

## 2021-08-10 DIAGNOSIS — I700 Atherosclerosis of aorta: Secondary | ICD-10-CM | POA: Diagnosis not present

## 2021-08-10 DIAGNOSIS — M25551 Pain in right hip: Secondary | ICD-10-CM | POA: Diagnosis not present

## 2021-08-10 DIAGNOSIS — M5416 Radiculopathy, lumbar region: Secondary | ICD-10-CM | POA: Diagnosis not present

## 2021-08-10 DIAGNOSIS — E669 Obesity, unspecified: Secondary | ICD-10-CM | POA: Diagnosis not present

## 2021-08-10 DIAGNOSIS — I272 Pulmonary hypertension, unspecified: Secondary | ICD-10-CM | POA: Diagnosis not present

## 2021-08-10 DIAGNOSIS — M9903 Segmental and somatic dysfunction of lumbar region: Secondary | ICD-10-CM | POA: Diagnosis not present

## 2021-08-12 DIAGNOSIS — M5136 Other intervertebral disc degeneration, lumbar region: Secondary | ICD-10-CM | POA: Diagnosis not present

## 2021-08-12 DIAGNOSIS — M5416 Radiculopathy, lumbar region: Secondary | ICD-10-CM | POA: Diagnosis not present

## 2021-08-12 DIAGNOSIS — M9903 Segmental and somatic dysfunction of lumbar region: Secondary | ICD-10-CM | POA: Diagnosis not present

## 2021-08-12 DIAGNOSIS — M9905 Segmental and somatic dysfunction of pelvic region: Secondary | ICD-10-CM | POA: Diagnosis not present

## 2021-08-17 DIAGNOSIS — M5136 Other intervertebral disc degeneration, lumbar region: Secondary | ICD-10-CM | POA: Diagnosis not present

## 2021-08-17 DIAGNOSIS — M5416 Radiculopathy, lumbar region: Secondary | ICD-10-CM | POA: Diagnosis not present

## 2021-08-17 DIAGNOSIS — M9905 Segmental and somatic dysfunction of pelvic region: Secondary | ICD-10-CM | POA: Diagnosis not present

## 2021-08-17 DIAGNOSIS — M9903 Segmental and somatic dysfunction of lumbar region: Secondary | ICD-10-CM | POA: Diagnosis not present

## 2021-08-20 DIAGNOSIS — M9903 Segmental and somatic dysfunction of lumbar region: Secondary | ICD-10-CM | POA: Diagnosis not present

## 2021-08-20 DIAGNOSIS — M5416 Radiculopathy, lumbar region: Secondary | ICD-10-CM | POA: Diagnosis not present

## 2021-08-20 DIAGNOSIS — M9905 Segmental and somatic dysfunction of pelvic region: Secondary | ICD-10-CM | POA: Diagnosis not present

## 2021-08-20 DIAGNOSIS — M5136 Other intervertebral disc degeneration, lumbar region: Secondary | ICD-10-CM | POA: Diagnosis not present

## 2021-08-25 DIAGNOSIS — M5416 Radiculopathy, lumbar region: Secondary | ICD-10-CM | POA: Diagnosis not present

## 2021-08-25 DIAGNOSIS — M9903 Segmental and somatic dysfunction of lumbar region: Secondary | ICD-10-CM | POA: Diagnosis not present

## 2021-08-25 DIAGNOSIS — M9905 Segmental and somatic dysfunction of pelvic region: Secondary | ICD-10-CM | POA: Diagnosis not present

## 2021-08-25 DIAGNOSIS — M5136 Other intervertebral disc degeneration, lumbar region: Secondary | ICD-10-CM | POA: Diagnosis not present

## 2021-09-21 ENCOUNTER — Encounter: Payer: Self-pay | Admitting: Dermatology

## 2021-09-21 ENCOUNTER — Other Ambulatory Visit: Payer: Self-pay

## 2021-09-21 ENCOUNTER — Ambulatory Visit (INDEPENDENT_AMBULATORY_CARE_PROVIDER_SITE_OTHER): Payer: No Typology Code available for payment source | Admitting: Dermatology

## 2021-09-21 DIAGNOSIS — L570 Actinic keratosis: Secondary | ICD-10-CM

## 2021-09-21 DIAGNOSIS — L80 Vitiligo: Secondary | ICD-10-CM

## 2021-09-21 DIAGNOSIS — L308 Other specified dermatitis: Secondary | ICD-10-CM

## 2021-09-21 DIAGNOSIS — L578 Other skin changes due to chronic exposure to nonionizing radiation: Secondary | ICD-10-CM | POA: Diagnosis not present

## 2021-09-21 MED ORDER — OPZELURA 1.5 % EX CREA: 1.0000 "application " | TOPICAL_CREAM | Freq: Every day | CUTANEOUS | 3 refills | Status: DC

## 2021-09-21 NOTE — Progress Notes (Signed)
Follow-Up Visit   Subjective  Richard Webster is a 66 y.o. male who presents for the following: Vitiligo (2 month recheck. Face, neck, arms, hands, torso.  Did not get Opzelura due to medication not being covered by insurance and was expensive. Did not receive Tacrolimus for atopic dermatitis due to cost. [facility]). The patient has spots, moles and lesions to be evaluated, some may be new or changing and the patient has concerns that these could be cancer.  The following portions of the chart were reviewed this encounter and updated as appropriate:  Tobacco   Allergies   Meds   Problems   Med Hx   Surg Hx   Fam Hx      Review of Systems: No other skin or systemic complaints except as noted in HPI or Assessment and Plan.  Objective  Well appearing patient in no apparent distress; mood and affect are within normal limits.  A focused examination was performed including face, neck, chest, arms, hands. Relevant physical exam findings are noted in the Assessment and Plan.  neck, face, arms, hands, torso Depigmented macules and patches               face x5, arms x3 (8) Erythematous thin papules/macules with gritty scale.    Assessment & Plan   Actinic Damage - chronic, secondary to cumulative UV radiation exposure/sun exposure over time - diffuse scaly erythematous macules with underlying dyspigmentation - Recommend daily broad spectrum sunscreen SPF 30+ to sun-exposed areas, reapply every 2 hours as needed.  - Recommend staying in the shade or wearing long sleeves, sun glasses (UVA+UVB protection) and wide brim hats (4-inch brim around the entire circumference of the hat). - Call for new or changing lesions.  Vitiligo neck, face, arms, hands, torso  Reviewed chronic nature, no cure and can be difficult to treat.  Vitiligo is an autoimmune condition which causes loss of skin pigment and is commonly seen on the face and may  also involve areas of trauma like hands, elbows, knees, and ankles.  Treatments include topical steroids and other topical anti-inflammatory ointments/creams and topical and oral Jak inhibitors.  Sometimes narrow band UV light therapy or Xtrac laser is helpful, both of which require twice weekly treatments for at least 3-6 months.  Antioxidant vitamins, such as Vitamins A,C,E,D, Folic Acid and B12 may be added to enhance treatment.  Start Opzelura once daily to affected areas.  Reviewed labs from 09/2020. TSH WNL.  AK (actinic keratosis) (8) face x5, arms x3 Actinic keratoses are precancerous spots that appear secondary to cumulative UV radiation exposure/sun exposure over time. They are chronic with expected duration over 1 year. A portion of actinic keratoses will progress to squamous cell carcinoma of the skin. It is not possible to reliably predict which spots will progress to skin cancer and so treatment is recommended to prevent development of skin cancer.  Recommend daily broad spectrum sunscreen SPF 30+ to sun-exposed areas, reapply every 2 hours as needed.  Recommend staying in the shade or wearing long sleeves, sun glasses (UVA+UVB protection) and wide brim hats (4-inch brim around the entire circumference of the hat). Call for new or changing lesions.  May consider field treatment in future.   Destruction of lesion - face x5, arms x3 Complexity: simple   Destruction method: cryotherapy   Informed consent: discussed and consent obtained   Timeout:  patient name, date of birth, surgical site, and procedure  verified Lesion destroyed using liquid nitrogen: Yes   Region frozen until ice ball extended beyond lesion: Yes   Outcome: patient tolerated procedure well with no complications   Post-procedure details: wound care instructions given    Return for Vitiligo Follow Up 7-9 months.  I, Emelia Salisbury, CMA, am acting as scribe for Sarina Ser, MD. Documentation: I have reviewed the  above documentation for accuracy and completeness, and I agree with the above.  Sarina Ser, MD

## 2021-09-21 NOTE — Patient Instructions (Signed)
Cryotherapy Aftercare  Wash gently with soap and water everyday.   Apply Vaseline and Band-Aid daily until healed.   Prior to procedure, discussed risks of blister formation, small wound, skin dyspigmentation, or rare scar following cryotherapy. Recommend Vaseline ointment to treated areas while healing.    Reviewed chronic nature, no cure and can be difficult to treat.  Vitiligo is an autoimmune condition which causes loss of skin pigment and is commonly seen on the face and may also involve areas of trauma like hands, elbows, knees, and ankles.  Treatments include topical steroids and other topical anti-inflammatory ointments/creams and topical and oral Jak inhibitors.  Sometimes narrow band UV light therapy or Xtrac laser is helpful, both of which require twice weekly treatments for at least 3-6 months.  Antioxidant vitamins, such as Vitamins A,C,E,D, Folic Acid and B12 may be added to enhance treatment.    If You Need Anything After Your Visit  If you have any questions or concerns for your doctor, please call our main line at [phone] and press option 4 to reach your doctor's medical assistant. If no one answers, please leave a voicemail as directed and we will return your call as soon as possible. Messages left after 4 pm will be answered the following business day.   You may also send [place] a message via [facility]. We typically respond to MyChart messages within 1-2 business days.  For prescription refills, please ask your pharmacy to contact our office. Our fax number is [phone].  If you have an urgent issue when the clinic is closed that cannot wait until the next business day, you can page your doctor at the number below.    Please note that while we do our best to be available for urgent issues outside of office hours, we are not available 24/7.   If you have an urgent issue and are unable to reach [place], you may choose to seek medical care at your doctor's office, retail clinic,  urgent care center, or emergency room.  If you have a medical emergency, please immediately call 911 or go to the emergency department.  Pager Numbers  - Dr. Nehemiah Massed: [phone]  - Dr. Laurence Ferrari: [phone]  - Dr. Nicole Kindred: [phone]  In the event of inclement weather, please call our main line at [phone] for an update on the status of any delays or closures.  Dermatology Medication Tips: Please keep the boxes that topical medications come in in order to help keep track of the instructions about where and how to use these. Pharmacies typically print the medication instructions only on the boxes and not directly on the medication tubes.   If your medication is too expensive, please contact our office at [phone] option 4 or send [place] a message through [place].   We are unable to tell what your co-pay for medications will be in advance as this is different depending on your insurance coverage. However, we may be able to find a substitute medication at lower cost or fill out paperwork to get insurance to cover a needed medication.   If a prior authorization is required to get your medication covered by your insurance company, please allow [place] 1-2 business days to complete this process.  Drug prices often vary depending on where the prescription is filled and some pharmacies may offer cheaper prices.  The website www.goodrx.com contains coupons for medications through different pharmacies. The prices here do not account for what the cost may be with help from insurance (it may  be cheaper with your insurance), but the website can give you the price if you did not use any insurance.  - You can print the associated coupon and take it with your prescription to the pharmacy.  - You may also stop by our office during regular business hours and pick up a GoodRx coupon card.  - If you need your prescription sent electronically to a different pharmacy, notify our office through [facility] or by phone at [phone] option 4.     Si Usted Necesita Algo Despus de Su Visita  Tambin puede enviarnos un mensaje a travs de Pharmacist, community. Por lo general respondemos a los mensajes de MyChart en el transcurso de 1 a 2 das hbiles.  Para renovar recetas, por favor pida a su farmacia que se ponga en contacto con nuestra oficina. Harland Dingwall de fax es [place] [phone].  Si tiene un asunto urgente cuando la clnica est cerrada y que no puede esperar hasta el siguiente da hbil, puede llamar/localizar a su doctor(a) al nmero que aparece a continuacin.   Por favor, tenga en cuenta que aunque hacemos todo lo posible para estar disponibles para asuntos urgentes fuera del horario de [place], no estamos disponibles las 24 horas del da, los 7 das de la [place].   Si tiene un problema urgente y no puede comunicarse con nosotros, puede optar por buscar atencin mdica  en el consultorio de su doctor(a), en una clnica privada, en un centro de atencin urgente o en una sala de emergencias.  Si tiene Engineering geologist, por favor llame inmediatamente al 911 o vaya a la sala de emergencias.  Nmeros de bper  - Dr. Nehemiah Massed: [phone]  - Dra. Moye: [phone]  - Dra. Nicole Kindred: [phone]  En caso de inclemencias del [place], por favor llame a Johnsie Kindred principal al [phone] para una actualizacin sobre el [place] de cualquier retraso o cierre.  Consejos para la medicacin en dermatologa: Por favor, guarde las cajas en las que vienen los medicamentos de uso tpico para ayudarle a seguir las instrucciones sobre dnde y cmo usarlos. Las farmacias generalmente imprimen las instrucciones del medicamento slo en las cajas y no directamente en los tubos del [place].   Si su medicamento es muy caro, por favor, pngase en contacto con Zigmund Daniel llamando al [phone] y presione la opcin 4 o envenos un mensaje a travs de Pharmacist, community.   No podemos decirle cul  ser su copago por los medicamentos por adelantado ya que esto es diferente dependiendo de la cobertura de su seguro. Sin embargo, es posible que podamos encontrar un medicamento sustituto a Electrical engineer un formulario para que el seguro cubra el medicamento que se considera necesario.   Si se requiere una autorizacin previa para que su compaa de seguros [place] su medicamento, por favor permtanos de 1 a 2 das hbiles para completar este proceso.  Los precios de los medicamentos varan con frecuencia dependiendo del Environmental consultant de dnde se surte la receta y alguna farmacias pueden ofrecer precios ms baratos.  El sitio web www.goodrx.com tiene cupones para medicamentos de Airline pilot. Los precios aqu no tienen en cuenta lo que podra costar con la ayuda del seguro (puede ser ms barato con su seguro), pero el sitio web puede darle el precio si no utiliz Research scientist (physical sciences).  - Puede imprimir el cupn correspondiente y llevarlo con su receta a la farmacia.  - Tambin puede pasar por nuestra oficina durante el horario de atencin regular y  recoger una tarjeta de cupones de GoodRx.  - Si necesita que su receta se enve electrnicamente a una farmacia diferente, informe a nuestra oficina a travs de MyChart de [place] o por telfono llamando al [phone] y presione la opcin 4.

## 2021-09-22 ENCOUNTER — Encounter: Payer: Self-pay | Admitting: Dermatology

## 2021-09-22 ENCOUNTER — Telehealth: Payer: Self-pay

## 2021-09-22 NOTE — Telephone Encounter (Signed)
Patient called to say that Sue Lush is "not covered and the pharmacist will not discuss the cash price because it is too expensive".  Advised patient as follows:  We can send in Protopic or Elidel, but would likely not get as good a result as with Opzelura.  Please advise pt.  He can just wait until Opzelura might be covered by Medicare or wait until oral treatment available and covered by Medicare.    Advise him that unfortunately we cannot do anything about Medicare coverage and because it is a [employer], he cannot get some of the discounts available to [employer].  But --- that could change if [place] Congress acts.

## 2021-09-22 NOTE — Telephone Encounter (Signed)
Patient advised. Will hold therapy at this time.

## 2021-09-22 NOTE — Telephone Encounter (Signed)
Due to patient's age and medicare policy, Opzelura not covered. Not cash pay options at this time.

## 2021-09-29 DIAGNOSIS — I25118 Atherosclerotic heart disease of native coronary artery with other forms of angina pectoris: Secondary | ICD-10-CM | POA: Diagnosis not present

## 2021-09-29 DIAGNOSIS — Z0000 Encounter for general adult medical examination without abnormal findings: Secondary | ICD-10-CM | POA: Diagnosis not present

## 2021-09-29 DIAGNOSIS — R739 Hyperglycemia, unspecified: Secondary | ICD-10-CM | POA: Diagnosis not present

## 2021-09-29 DIAGNOSIS — E782 Mixed hyperlipidemia: Secondary | ICD-10-CM | POA: Diagnosis not present

## 2021-09-29 DIAGNOSIS — D369 Benign neoplasm, unspecified site: Secondary | ICD-10-CM | POA: Diagnosis not present

## 2021-09-29 DIAGNOSIS — Z125 Encounter for screening for malignant neoplasm of prostate: Secondary | ICD-10-CM | POA: Diagnosis not present

## 2021-10-28 ENCOUNTER — Ambulatory Visit (INDEPENDENT_AMBULATORY_CARE_PROVIDER_SITE_OTHER): Payer: No Typology Code available for payment source | Admitting: Urology

## 2021-10-28 ENCOUNTER — Encounter: Payer: Self-pay | Admitting: Urology

## 2021-10-28 ENCOUNTER — Ambulatory Visit
Admission: RE | Admit: 2021-10-28 | Discharge: 2021-10-28 | Disposition: A | Discharge status: Home / Self Care | Payer: No Typology Code available for payment source | Attending: Urology | Admitting: Urology

## 2021-10-28 ENCOUNTER — Ambulatory Visit
Admission: RE | Admit: 2021-10-28 | Discharge: 2021-10-28 | Disposition: A | Discharge status: Home / Self Care | Payer: No Typology Code available for payment source | Source: Ambulatory Visit | Attending: Urology | Admitting: Urology

## 2021-10-28 VITALS — BP 137/79 | HR 64 | Ht 72.0 in | Wt 195.0 lb

## 2021-10-28 DIAGNOSIS — M47816 Spondylosis without myelopathy or radiculopathy, lumbar region: Secondary | ICD-10-CM | POA: Diagnosis not present

## 2021-10-28 DIAGNOSIS — N200 Calculus of kidney: Secondary | ICD-10-CM | POA: Insufficient documentation

## 2021-10-28 DIAGNOSIS — R109 Unspecified abdominal pain: Secondary | ICD-10-CM

## 2021-10-28 NOTE — Progress Notes (Signed)
? ?10/28/2021 ?10:11 AM  ? ?Richard Webster ?1956/01/10 ?[id number] ? ?Referring provider: Rusty Aus, MD ?[place] ?[facility] ?[place],  [place] [address] ? ?Chief Complaint  ?Patient presents with  ? Nephrolithiasis  ? ? ?Urologic history: ?1.  Microhematuria ?UA 09/2018 210-50 RBC ?CTU 1.7 x 6 x 9 mm nonobstructing left lower pole calculus ?Cystoscopy lateral lobe enlargement with hypervascularity ? ?2.  Recurrent nephrolithiasis ?Ureteroscopic removal 11 mm left renal pelvic calculus Dr. Jacqlyn Larsen 09/2017 ?2 prior SWL ?Left lower pole calculus as above ? ? ?HPI: ?66 y.o. male presents for annual follow-up. ? ?Overall doing well since last years visit but recently has noted right flank pain which is mild in severity ?No precipitating, aggravating or alleviating factors ?Denies dysuria or gross hematuria ?Moderate lower urinary tract symptoms including urgency, frequency, weak stream and nocturia x2 ?IPSS 14/35 ?UA 09/29/2021 negative; PSA 0.97 ? ?PMH: ?Past Medical History:  ?Diagnosis Date  ? Anxiety   ? pt denies  ? Arthritis   ? Atypical chest pain   ? BPH (benign prostatic hyperplasia)   ? Chronic kidney disease   ? kidney stones  ? H/O rheumatic heart disease   ? Heart murmur   ? History of right bundle branch block (RBBB)   ? Hypertension   ? Palpitations   ? Prostatitis   ? Rheumatic fever   ? ? ?Surgical History: ?Past Surgical History:  ?Procedure Laterality Date  ? COLONOSCOPY    ? LITHOTRIPSY    ? x2 for kidney stones  ? POLYPECTOMY    ? RIGHT/LEFT HEART CATH AND CORONARY ANGIOGRAPHY N/A 11/12/2019  ? Procedure: RIGHT/LEFT HEART CATH AND CORONARY ANGIOGRAPHY;  Surgeon: Yolonda Kida, MD;  Location: [facility];  Service: Cardiovascular;  Laterality: N/A;  ? ? ?Home Medications:  ?Allergies as of 10/28/2021   ?No Known Allergies ?  ? ?  ?Medication List  ?  ? ?  ? Accurate as of October 28, 2021 10:11 AM. If you have any questions, ask your nurse or doctor.  ?  ?  ? ?   ? ?aspirin EC 81 MG tablet ?Take 81 mg by mouth daily. ?  ?etodolac 400 MG tablet ?Commonly known as: LODINE ?Take 400 mg by mouth daily. ?  ?naproxen sodium 220 MG tablet ?Commonly known as: ALEVE ?Take 220 mg by mouth daily as needed (pain). ?  ?nitroGLYCERIN 0.4 MG SL tablet ?Commonly known as: NITROSTAT ?Place under the tongue. ?  ?olmesartan 20 MG tablet ?Commonly known as: BENICAR ?Take by mouth. ?  ?olmesartan-hydrochlorothiazide 20-12.5 MG tablet ?Commonly known as: BENICAR HCT ?Take 1 tablet by mouth daily. ?  ?omeprazole 40 MG capsule ?Commonly known as: PRILOSEC ?Take 40 mg by mouth daily before breakfast. ?  ?Opzelura 1.5 % Crea ?Generic drug: Ruxolitinib Phosphate ?Apply 1 application topically daily. ?  ?pantoprazole 40 MG tablet ?Commonly known as: PROTONIX ?Take 40 mg by mouth daily. ?What changed: Another medication with the same name was removed. Continue taking this medication, and follow the directions you see here. ?Changed by: Abbie Sons, MD ?  ?rosuvastatin 20 MG tablet ?Commonly known as: CRESTOR ?Take by mouth. ?  ?tacrolimus 0.1 % ointment ?Commonly known as: PROTOPIC ?Apply 1-2 times as needed to affected area of rash on chest as directed. ?  ? ?  ? ? ?Allergies: No Known Allergies ? ?Family History: ?Family History  ?Problem Relation Age of Onset  ? Stroke Mother   ? Dementia  Mother   ? Colon cancer Neg Hx   ? Rectal cancer Neg Hx   ? Stomach cancer Neg Hx   ? Colon polyps Neg Hx   ? Esophageal cancer Neg Hx   ? ? ?Social History:  reports that he quit smoking about 38 years ago. His smoking use included cigarettes. He has a 7.50 pack-year smoking history. He has quit using smokeless tobacco. He reports current alcohol use. He reports that he does not use drugs. ? ? ?Physical Exam: ?BP 137/79   Pulse 64   Ht 6' (1.829 m)   Wt 195 lb (88.5 kg)   BMI 26.45 kg/m?   ?Constitutional:  Alert and oriented, No acute distress. ?HEENT: [place] AT, moist mucus membranes.  Trachea midline, no  masses. ?Cardiovascular: No clubbing, cyanosis, or edema. ?Respiratory: Normal respiratory effort, no increased work of breathing. ?Skin: No rashes, bruises or suspicious lesions. ?Neurologic: Grossly intact, no focal deficits, moving all 4 extremities. ?Psychiatric: Normal mood and affect. ? ?Pertinent imaging: ?Images of a KUB performed today were personally reviewed and interpreted.  Stable left lower pole renal calculus ? ?Assessment & Plan:   ? ?1.  Nephrolithiasis ?Stable left lower pole renal calculus ? ?2.  Right flank pain ?Although not severe his pain is bothersome ?Schedule stone protocol CT ? ? ?Abbie Sons, MD ? ?[place] ?[address] ?[place], [place] [address] ?[phone] ? ?

## 2021-11-18 ENCOUNTER — Ambulatory Visit
Admission: RE | Admit: 2021-11-18 | Discharge: 2021-11-18 | Disposition: A | Discharge status: Home / Self Care | Payer: No Typology Code available for payment source | Source: Ambulatory Visit | Attending: Urology | Admitting: Urology

## 2021-11-18 DIAGNOSIS — K7689 Other specified diseases of liver: Secondary | ICD-10-CM | POA: Diagnosis not present

## 2021-11-18 DIAGNOSIS — I700 Atherosclerosis of aorta: Secondary | ICD-10-CM | POA: Diagnosis not present

## 2021-11-18 DIAGNOSIS — R109 Unspecified abdominal pain: Secondary | ICD-10-CM | POA: Insufficient documentation

## 2021-11-18 DIAGNOSIS — N200 Calculus of kidney: Secondary | ICD-10-CM | POA: Insufficient documentation

## 2021-11-19 ENCOUNTER — Telehealth: Payer: Self-pay | Admitting: *Deleted

## 2021-11-19 NOTE — Telephone Encounter (Signed)
-----   Message from Nori Riis, PA-C sent at 11/19/2021  7:53 AM EDT ----- ?Please let Mr. Righi know that his CT scan showed a left sided kidney stone in his left kidney, but no stones were seen in his right kidney. ?

## 2021-11-23 ENCOUNTER — Telehealth: Payer: Self-pay | Admitting: *Deleted

## 2021-11-23 NOTE — Telephone Encounter (Signed)
CT scan showed a left sided kidney stone in his left kidney, but no stones were seen in his right kidney. ? ?Notified patient as instructed, patient pleased. Discussed follow-up appointments, patient agrees ? ?

## 2021-12-16 DIAGNOSIS — J302 Other seasonal allergic rhinitis: Secondary | ICD-10-CM | POA: Diagnosis not present

## 2021-12-16 DIAGNOSIS — R0982 Postnasal drip: Secondary | ICD-10-CM | POA: Diagnosis not present

## 2021-12-16 DIAGNOSIS — K219 Gastro-esophageal reflux disease without esophagitis: Secondary | ICD-10-CM | POA: Diagnosis not present

## 2022-02-08 DIAGNOSIS — E782 Mixed hyperlipidemia: Secondary | ICD-10-CM | POA: Diagnosis not present

## 2022-02-08 DIAGNOSIS — I10 Essential (primary) hypertension: Secondary | ICD-10-CM | POA: Diagnosis not present

## 2022-02-08 DIAGNOSIS — Z955 Presence of coronary angioplasty implant and graft: Secondary | ICD-10-CM | POA: Diagnosis not present

## 2022-02-08 DIAGNOSIS — I272 Pulmonary hypertension, unspecified: Secondary | ICD-10-CM | POA: Diagnosis not present

## 2022-02-08 DIAGNOSIS — I700 Atherosclerosis of aorta: Secondary | ICD-10-CM | POA: Diagnosis not present

## 2022-02-08 DIAGNOSIS — I25118 Atherosclerotic heart disease of native coronary artery with other forms of angina pectoris: Secondary | ICD-10-CM | POA: Diagnosis not present

## 2022-02-25 DIAGNOSIS — R439 Unspecified disturbances of smell and taste: Secondary | ICD-10-CM | POA: Diagnosis not present

## 2022-02-25 DIAGNOSIS — J301 Allergic rhinitis due to pollen: Secondary | ICD-10-CM | POA: Diagnosis not present

## 2022-03-16 DIAGNOSIS — R0602 Shortness of breath: Secondary | ICD-10-CM | POA: Diagnosis not present

## 2022-03-16 DIAGNOSIS — R432 Parageusia: Secondary | ICD-10-CM | POA: Diagnosis not present

## 2022-03-16 DIAGNOSIS — R062 Wheezing: Secondary | ICD-10-CM | POA: Diagnosis not present

## 2022-05-17 ENCOUNTER — Other Ambulatory Visit: Payer: Self-pay | Admitting: *Deleted

## 2022-05-17 DIAGNOSIS — N200 Calculus of kidney: Secondary | ICD-10-CM

## 2022-05-18 NOTE — Progress Notes (Signed)
05/19/2022 9:36 AM   Richard Webster 26-Oct-1955 [id number]  Referring provider: Rusty Aus, MD [facility] [place],  [place] [address]  Urological history: 1.  High risk hematuria -Former smoker -CTU (10/2020) - nephrolithiasis and tiny bilateral cysts -cysto (10/2020) -  Lateral lobe enlargement with hypervascularity prostate  -no gross heme -UA ***  2.  Nephrolithiasis -Stone composition-calcium oxalate -24-hour urine demonstrated low fluid intake -Ureteroscopic removal 11 mm left renal pelvic calculus Dr. Jacqlyn Larsen 09/2017 -2 prior SWL -CT renal stone study (10/2021) - 9 mm left renal calculus  No chief complaint on file.   HPI: Richard Webster is a 66 y.o. male who presents today for gross hematuria.  UA ***  KUB ***     PMH: Past Medical History:  Diagnosis Date   Anxiety    pt denies   Arthritis    Atypical chest pain    BPH (benign prostatic hyperplasia)    Chronic kidney disease    kidney stones   H/O rheumatic heart disease    Heart murmur    History of right bundle branch block (RBBB)    Hypertension    Palpitations    Prostatitis    Rheumatic fever     Surgical History: Past Surgical History:  Procedure Laterality Date   COLONOSCOPY     LITHOTRIPSY     x2 for kidney stones   POLYPECTOMY     RIGHT/LEFT HEART CATH AND CORONARY ANGIOGRAPHY N/A 11/12/2019   Procedure: RIGHT/LEFT HEART CATH AND CORONARY ANGIOGRAPHY;  Surgeon: Yolonda Kida, MD;  Location: [facility];  Service: Cardiovascular;  Laterality: N/A;    Home Medications:  Allergies as of 05/19/2022   No Known Allergies      Medication List        Accurate as of May 18, 2022  9:36 AM. If you have any questions, ask your nurse or doctor.          aspirin EC 81 MG tablet Take 81 mg by mouth daily.   etodolac 400 MG tablet Commonly known as: LODINE Take 400 mg by mouth daily.   naproxen sodium 220 MG  tablet Commonly known as: ALEVE Take 220 mg by mouth daily as needed (pain).   nitroGLYCERIN 0.4 MG SL tablet Commonly known as: NITROSTAT Place under the tongue.   olmesartan 20 MG tablet Commonly known as: BENICAR Take by mouth.   olmesartan-hydrochlorothiazide 20-12.5 MG tablet Commonly known as: BENICAR HCT Take 1 tablet by mouth daily.   omeprazole 40 MG capsule Commonly known as: PRILOSEC Take 40 mg by mouth daily before breakfast.   Opzelura 1.5 % Crea Generic drug: Ruxolitinib Phosphate Apply 1 application topically daily.   pantoprazole 40 MG tablet Commonly known as: PROTONIX Take 40 mg by mouth daily.   rosuvastatin 20 MG tablet Commonly known as: CRESTOR Take by mouth.   tacrolimus 0.1 % ointment Commonly known as: PROTOPIC Apply 1-2 times as needed to affected area of rash on chest as directed.        Allergies: No Known Allergies  Family History: Family History  Problem Relation Age of Onset   Stroke Mother    Dementia Mother    Colon cancer Neg Hx    Rectal cancer Neg Hx    Stomach cancer Neg Hx    Colon polyps Neg Hx    Esophageal cancer Neg Hx     Social History:  reports that he quit  smoking about 38 years ago. His smoking use included cigarettes. He has a 7.50 pack-year smoking history. He has quit using smokeless tobacco. He reports current alcohol use. He reports that he does not use drugs.  ROS: Pertinent ROS in HPI  Physical Exam: There were no vitals taken for this visit.  Constitutional:  Well nourished. Alert and oriented, No acute distress. HEENT: [place] AT, moist mucus membranes.  Trachea midline, no masses. Cardiovascular: No clubbing, cyanosis, or edema. Respiratory: Normal respiratory effort, no increased work of breathing. GI: Abdomen is soft, non tender, non distended, no abdominal masses. Liver and spleen not palpable.  No hernias appreciated.  Stool sample for occult testing is not indicated.   GU: No CVA tenderness.  No  bladder fullness or masses.  Patient with circumcised/uncircumcised phallus. ***Foreskin easily retracted***  Urethral meatus is patent.  No penile discharge. No penile lesions or rashes. Scrotum without lesions, cysts, rashes and/or edema.  Testicles are located scrotally bilaterally. No masses are appreciated in the testicles. Left and right epididymis are normal. Rectal: Patient with  normal sphincter tone. Anus and perineum without scarring or rashes. No rectal masses are appreciated. Prostate is approximately *** grams, *** nodules are appreciated. Seminal vesicles are normal. Skin: No rashes, bruises or suspicious lesions. Lymph: No cervical or inguinal adenopathy. Neurologic: Grossly intact, no focal deficits, moving all 4 extremities. Psychiatric: Normal mood and affect.  Laboratory Data: See Epic and HPI I have reviewed the labs.   Pertinent Imaging: *** I have independently reviewed the films.    Assessment & Plan:  ***  1. High risk hematuria -former smoker -work up in 10/2020 -nephrolithiasis and hypervascular prostate -gross heme -UA *** -Urine sent for culture  No follow-ups on file.  These notes generated with voice recognition software. I apologize for typographical errors.  [place], [place] [address]  [place], [place] [address] [phone]

## 2022-05-19 ENCOUNTER — Ambulatory Visit
Admission: RE | Admit: 2022-05-19 | Discharge: 2022-05-19 | Disposition: A | Discharge status: Home / Self Care | Payer: No Typology Code available for payment source | Source: Ambulatory Visit | Attending: Urology | Admitting: Urology

## 2022-05-19 ENCOUNTER — Ambulatory Visit (INDEPENDENT_AMBULATORY_CARE_PROVIDER_SITE_OTHER): Payer: No Typology Code available for payment source | Admitting: Urology

## 2022-05-19 ENCOUNTER — Encounter: Payer: Self-pay | Admitting: Urology

## 2022-05-19 VITALS — BP 156/88 | HR 60 | Ht 67.0 in | Wt 203.0 lb

## 2022-05-19 DIAGNOSIS — N200 Calculus of kidney: Secondary | ICD-10-CM | POA: Diagnosis not present

## 2022-05-19 DIAGNOSIS — R310 Gross hematuria: Secondary | ICD-10-CM | POA: Diagnosis not present

## 2022-05-19 DIAGNOSIS — R1032 Left lower quadrant pain: Secondary | ICD-10-CM | POA: Diagnosis not present

## 2022-05-19 LAB — URINALYSIS, COMPLETE
Bilirubin, UA: NEGATIVE
Glucose, UA: NEGATIVE
Ketones, UA: NEGATIVE
Leukocytes,UA: NEGATIVE
Nitrite, UA: NEGATIVE
Protein,UA: NEGATIVE
RBC, UA: NEGATIVE
Specific Gravity, UA: 1.01 (ref 1.005–1.030)
Urobilinogen, Ur: 0.2 mg/dL (ref 0.2–1.0)
pH, UA: 6 (ref 5.0–7.5)

## 2022-05-19 LAB — MICROSCOPIC EXAMINATION: Bacteria, UA: NONE SEEN

## 2022-05-19 LAB — BLADDER SCAN AMB NON-IMAGING

## 2022-05-23 LAB — CULTURE, URINE COMPREHENSIVE

## 2022-05-24 ENCOUNTER — Ambulatory Visit: Payer: No Typology Code available for payment source | Admitting: Dermatology

## 2022-05-24 ENCOUNTER — Telehealth: Payer: Self-pay

## 2022-05-24 MED ORDER — AMOXICILLIN-POT CLAVULANATE 875-125 MG PO TABS: 1.0000 | ORAL_TABLET | Freq: Two times a day (BID) | ORAL | 0 refills | Status: AC

## 2022-05-24 NOTE — Telephone Encounter (Signed)
-----   Message from Nori Riis, PA-C sent at 05/24/2022  8:16 AM EDT ----- Please let Mr. Delpriore know that his urine culture grew out a bacteria that typically does not cause infections, but since he is having symptoms, we will go ahead and treat.   He will need to start Augmentin 875/125, twice daily for seven days.

## 2022-05-24 NOTE — Telephone Encounter (Signed)
LMOM for pt to return call. 

## 2022-05-24 NOTE — Telephone Encounter (Signed)
Attempt to reach pt a 2nd time unsuccessfully. Abx Rx sent to pts pharmacy.

## 2022-06-02 ENCOUNTER — Ambulatory Visit
Admission: RE | Admit: 2022-06-02 | Discharge: 2022-06-02 | Disposition: A | Discharge status: Home / Self Care | Payer: No Typology Code available for payment source | Source: Ambulatory Visit | Attending: Urology | Admitting: Urology

## 2022-06-02 DIAGNOSIS — I700 Atherosclerosis of aorta: Secondary | ICD-10-CM | POA: Diagnosis not present

## 2022-06-02 DIAGNOSIS — N200 Calculus of kidney: Secondary | ICD-10-CM | POA: Insufficient documentation

## 2022-06-21 NOTE — Progress Notes (Unsigned)
06/22/2022 9:58 AM   Richard Webster [birth year] [id number]  Referring provider: Rusty Aus, MD [facility] [place],  [place] [address]  Urological history: 1.  High risk hematuria -Former smoker -CTU (10/2020) - nephrolithiasis and tiny bilateral cysts -cysto (10/2020) -  Lateral lobe enlargement with hypervascularity prostate  -reports of gross heme -UA negative for micro heme  2.  Nephrolithiasis -Stone composition-calcium oxalate -24-hour urine demonstrated low fluid intake -Ureteroscopic removal 11 mm left renal pelvic calculus Dr. Jacqlyn Larsen 09/2017 -2 prior SWL -CT renal stone study (10/2021) - 9 mm left renal calculus  Chief Complaint  Patient presents with   Hematuria   Nephrolithiasis    HPI: Richard Webster is a 66 y.o. male who presents today for gross hematuria.  At his visit on 05/19/2022, he stated that after having intercourse with his wife, he noticed gross hematuria the next day with one void and then it cleared.  He then started to have pain in the groin bilaterally and into the testicles.  He felt that this was very similar to previous stone episodes he has had in the past.  He felt he was emptying his bladder and his PVR was 197 mL.  He stated his pain is not relieved by voiding.  UA yellow clear, specific gravity 1.010, pH 6.0, WBC 0-5, 0-2 RBC's and 0-10 epithelial cells.  KUB (05/19/2022) stable 9 mm left renal calculus.  Urine culture positive for lactobacillus species.  He was given seven days of Augmentin.   CT renal stone study (05/2022) - stable left renal stone   He states he did not pick up the antibiotic.  He did have a few instances of gross hematuria the day after Thanksgiving.  He feels that it is related to straining.  He also continues to have his suprapubic discomfort.  Patient denies any modifying or aggravating factors.  Patient denies any dysuria or flank pain.  Patient denies any fevers, chills,  nausea or vomiting.    UA yellow clear, specific gravity 1.020, pH 6.0, 0-5 WBCs, 0-2 RBCs, 0-10 epithelial cells and a few bacteria.    PMH: Past Medical History:  Diagnosis Date   Anxiety    pt denies   Arthritis    Atypical chest pain    BPH (benign prostatic hyperplasia)    Chronic kidney disease    kidney stones   H/O rheumatic heart disease    Heart murmur    History of right bundle branch block (RBBB)    Hypertension    Palpitations    Prostatitis    Rheumatic fever     Surgical History: Past Surgical History:  Procedure Laterality Date   COLONOSCOPY     LITHOTRIPSY     x2 for kidney stones   POLYPECTOMY     RIGHT/LEFT HEART CATH AND CORONARY ANGIOGRAPHY N/A 11/12/2019   Procedure: RIGHT/LEFT HEART CATH AND CORONARY ANGIOGRAPHY;  Surgeon: Yolonda Kida, MD;  Location: [facility];  Service: Cardiovascular;  Laterality: N/A;    Home Medications:  Allergies as of 06/22/2022   No Known Allergies      Medication List        Accurate as of June 22, 2022  9:58 AM. If you have any questions, ask your nurse or doctor.          amoxicillin-clavulanate 875-125 MG tablet Commonly known as: AUGMENTIN Take 1 tablet by mouth every 12 (twelve) hours.   aspirin EC  81 MG tablet Take 81 mg by mouth daily.   etodolac 400 MG tablet Commonly known as: LODINE Take 400 mg by mouth daily.   naproxen sodium 220 MG tablet Commonly known as: ALEVE Take 220 mg by mouth daily as needed (pain).   nitroGLYCERIN 0.4 MG SL tablet Commonly known as: NITROSTAT Place under the tongue.   olmesartan 20 MG tablet Commonly known as: BENICAR Take by mouth.   olmesartan-hydrochlorothiazide 20-12.5 MG tablet Commonly known as: BENICAR HCT Take 1 tablet by mouth daily.   omeprazole 40 MG capsule Commonly known as: PRILOSEC Take 40 mg by mouth daily before breakfast.   Opzelura 1.5 % Crea Generic drug: Ruxolitinib Phosphate Apply 1 application topically  daily.   pantoprazole 40 MG tablet Commonly known as: PROTONIX Take 40 mg by mouth daily.   rosuvastatin 20 MG tablet Commonly known as: CRESTOR Take by mouth.   tacrolimus 0.1 % ointment Commonly known as: PROTOPIC Apply 1-2 times as needed to affected area of rash on chest as directed.        Allergies: No Known Allergies  Family History: Family History  Problem Relation Age of Onset   Stroke Mother    Dementia Mother    Colon cancer Neg Hx    Rectal cancer Neg Hx    Stomach cancer Neg Hx    Colon polyps Neg Hx    Esophageal cancer Neg Hx     Social History:  reports that he quit smoking about 38 years ago. His smoking use included cigarettes. He has a 7.50 pack-year smoking history. He has quit using smokeless tobacco. He reports current alcohol use. He reports that he does not use drugs.  ROS: Pertinent ROS in HPI  Physical Exam: BP (!) 175/89   Pulse 60   Ht '5\' 7"'$  (1.702 m)   Wt 200 lb (90.7 kg)   BMI 31.32 kg/m   Constitutional:  Well nourished. Alert and oriented, No acute distress. HEENT: [place] AT, moist mucus membranes.  Trachea midline Cardiovascular: No clubbing, cyanosis, or edema. Respiratory: Normal respiratory effort, no increased work of breathing. Neurologic: Grossly intact, no focal deficits, moving all 4 extremities. Psychiatric: Normal mood and affect.   Laboratory Data: See Epic and HPI I have reviewed the labs.   Pertinent Imaging: Narrative & Impression  CLINICAL DATA:  Gross hematuria, flank pain, pelvic pain.   EXAM: CT ABDOMEN AND PELVIS WITHOUT CONTRAST   TECHNIQUE: Multidetector CT imaging of the abdomen and pelvis was performed following the standard protocol without IV contrast.   RADIATION DOSE REDUCTION: This exam was performed according to the departmental dose-optimization program which includes automated exposure control, adjustment of the mA and/or kV according to patient size and/or use of iterative reconstruction  technique.   COMPARISON:  11/20/2021.   FINDINGS: Lower chest: Lung bases are clear. Heart size normal. No pericardial or pleural effusion. Atherosclerotic calcification of the aorta. Distal esophagus is grossly unremarkable.   Hepatobiliary: Liver and gallbladder are unremarkable. No biliary ductal dilatation.   Pancreas: Negative.   Spleen: Negative.   Adrenals/Urinary Tract: Adrenal glands and right kidney are unremarkable. Stone in the lower pole left kidney. Ureters are decompressed. Bladder is grossly unremarkable.   Stomach/Bowel: Stomach, small bowel, appendix and colon are unremarkable.   Vascular/Lymphatic: Atherosclerotic calcification of the aorta. No pathologically enlarged lymph nodes.   Reproductive: Prostate is visualized.   Other: Mesenteries and peritoneum are unremarkable.  No free fluid.   Musculoskeletal: Degenerative changes in the spine. No  worrisome lytic or sclerotic lesions.   IMPRESSION: 1. Left renal stone.  No acute findings. 2.  Aortic atherosclerosis (ICD10-I70.0).     Electronically Signed   By: Lorin Picket M.D.   On: 06/04/2022 15:44    I have independently reviewed the films.    Assessment & Plan:    1. High risk hematuria -former smoker -work up in 10/2020 -nephrolithiasis and hypervascular prostate -gross heme-may be due to the straining and/or infection -UA negative for gross heme -Urine sent for culture -start the Augmentin  2. Left renal stone -There is no hydro or fragments appreciated in the ureter on his CT -At this time, he would like to see if the antibiotic will clear up his suprapubic discomfort and gross hematuria  3. Suprapubic discomfort -May be due to UTI -We will reassess after he completes antibiotic   Return for will regroup after repeat urine culture results .  These notes generated with voice recognition software. I apologize for typographical errors.  [place], [place] [address]  [place], [place] [address] [phone]

## 2022-06-22 ENCOUNTER — Ambulatory Visit (INDEPENDENT_AMBULATORY_CARE_PROVIDER_SITE_OTHER): Payer: No Typology Code available for payment source | Admitting: Urology

## 2022-06-22 ENCOUNTER — Encounter: Payer: Self-pay | Admitting: Urology

## 2022-06-22 VITALS — BP 175/89 | HR 60 | Ht 67.0 in | Wt 200.0 lb

## 2022-06-22 DIAGNOSIS — N200 Calculus of kidney: Secondary | ICD-10-CM | POA: Diagnosis not present

## 2022-06-22 DIAGNOSIS — R310 Gross hematuria: Secondary | ICD-10-CM | POA: Diagnosis not present

## 2022-06-22 DIAGNOSIS — R102 Pelvic and perineal pain: Secondary | ICD-10-CM

## 2022-06-22 DIAGNOSIS — Z87442 Personal history of urinary calculi: Secondary | ICD-10-CM

## 2022-06-22 LAB — URINALYSIS, COMPLETE
Bilirubin, UA: NEGATIVE
Glucose, UA: NEGATIVE
Ketones, UA: NEGATIVE
Leukocytes,UA: NEGATIVE
Nitrite, UA: NEGATIVE
Protein,UA: NEGATIVE
RBC, UA: NEGATIVE
Specific Gravity, UA: 1.02 (ref 1.005–1.030)
Urobilinogen, Ur: 0.2 mg/dL (ref 0.2–1.0)
pH, UA: 6 (ref 5.0–7.5)

## 2022-06-22 LAB — MICROSCOPIC EXAMINATION

## 2022-06-22 MED ORDER — AMOXICILLIN-POT CLAVULANATE 875-125 MG PO TABS: 1.0000 | ORAL_TABLET | Freq: Two times a day (BID) | ORAL | 0 refills | Status: DC

## 2022-06-24 ENCOUNTER — Telehealth: Payer: Self-pay

## 2022-06-24 LAB — CULTURE, URINE COMPREHENSIVE

## 2022-06-24 NOTE — Telephone Encounter (Signed)
Notified pt as advised, pt expressed understanding.  ?

## 2022-06-24 NOTE — Telephone Encounter (Signed)
-----   Message from Nori Riis, PA-C sent at 06/24/2022  2:02 PM EST ----- Please let Mr. Koors know that his recent urine culture was negative.  I would finish the antibiotic as his previous culture was positive.  Once he is finished the Augmentin, I would like to know if his suprapubic discomfort abated.

## 2022-09-01 DIAGNOSIS — M5116 Intervertebral disc disorders with radiculopathy, lumbar region: Secondary | ICD-10-CM | POA: Diagnosis not present

## 2022-09-01 DIAGNOSIS — I10 Essential (primary) hypertension: Secondary | ICD-10-CM | POA: Diagnosis not present

## 2022-09-24 DIAGNOSIS — Z125 Encounter for screening for malignant neoplasm of prostate: Secondary | ICD-10-CM | POA: Diagnosis not present

## 2022-09-24 DIAGNOSIS — E782 Mixed hyperlipidemia: Secondary | ICD-10-CM | POA: Diagnosis not present

## 2022-09-24 DIAGNOSIS — R739 Hyperglycemia, unspecified: Secondary | ICD-10-CM | POA: Diagnosis not present

## 2022-10-01 DIAGNOSIS — E1151 Type 2 diabetes mellitus with diabetic peripheral angiopathy without gangrene: Secondary | ICD-10-CM | POA: Diagnosis not present

## 2022-10-01 DIAGNOSIS — D369 Benign neoplasm, unspecified site: Secondary | ICD-10-CM | POA: Diagnosis not present

## 2022-10-01 DIAGNOSIS — I25118 Atherosclerotic heart disease of native coronary artery with other forms of angina pectoris: Secondary | ICD-10-CM | POA: Diagnosis not present

## 2022-10-01 DIAGNOSIS — Z0000 Encounter for general adult medical examination without abnormal findings: Secondary | ICD-10-CM | POA: Diagnosis not present

## 2022-10-01 DIAGNOSIS — R972 Elevated prostate specific antigen [PSA]: Secondary | ICD-10-CM | POA: Diagnosis not present

## 2022-10-29 ENCOUNTER — Ambulatory Visit: Payer: No Typology Code available for payment source | Admitting: Urology

## 2023-01-10 DIAGNOSIS — M5116 Intervertebral disc disorders with radiculopathy, lumbar region: Secondary | ICD-10-CM | POA: Diagnosis not present

## 2023-01-10 DIAGNOSIS — M5416 Radiculopathy, lumbar region: Secondary | ICD-10-CM | POA: Diagnosis not present

## 2023-01-10 DIAGNOSIS — I10 Essential (primary) hypertension: Secondary | ICD-10-CM | POA: Diagnosis not present

## 2023-01-10 DIAGNOSIS — M5136 Other intervertebral disc degeneration, lumbar region: Secondary | ICD-10-CM | POA: Diagnosis not present

## 2023-01-10 DIAGNOSIS — M4146 Neuromuscular scoliosis, lumbar region: Secondary | ICD-10-CM | POA: Diagnosis not present

## 2023-01-11 ENCOUNTER — Other Ambulatory Visit: Payer: Self-pay | Admitting: Internal Medicine

## 2023-01-11 DIAGNOSIS — M5416 Radiculopathy, lumbar region: Secondary | ICD-10-CM

## 2023-01-20 ENCOUNTER — Ambulatory Visit
Admission: RE | Admit: 2023-01-20 | Discharge: 2023-01-20 | Disposition: A | Discharge status: Home / Self Care | Payer: No Typology Code available for payment source | Source: Ambulatory Visit | Attending: Internal Medicine | Admitting: Internal Medicine

## 2023-01-20 DIAGNOSIS — M5116 Intervertebral disc disorders with radiculopathy, lumbar region: Secondary | ICD-10-CM | POA: Diagnosis not present

## 2023-01-20 DIAGNOSIS — R609 Edema, unspecified: Secondary | ICD-10-CM | POA: Diagnosis not present

## 2023-01-20 DIAGNOSIS — M5416 Radiculopathy, lumbar region: Secondary | ICD-10-CM | POA: Diagnosis not present

## 2023-01-20 DIAGNOSIS — M5117 Intervertebral disc disorders with radiculopathy, lumbosacral region: Secondary | ICD-10-CM | POA: Diagnosis not present

## 2023-01-20 DIAGNOSIS — M48061 Spinal stenosis, lumbar region without neurogenic claudication: Secondary | ICD-10-CM | POA: Diagnosis not present

## 2023-01-25 ENCOUNTER — Encounter: Payer: Self-pay | Admitting: Internal Medicine

## 2023-02-07 DIAGNOSIS — M25552 Pain in left hip: Secondary | ICD-10-CM | POA: Diagnosis not present

## 2023-02-07 DIAGNOSIS — M25551 Pain in right hip: Secondary | ICD-10-CM | POA: Diagnosis not present

## 2023-02-07 DIAGNOSIS — M5416 Radiculopathy, lumbar region: Secondary | ICD-10-CM | POA: Diagnosis not present

## 2023-02-07 DIAGNOSIS — G8929 Other chronic pain: Secondary | ICD-10-CM | POA: Diagnosis not present

## 2023-02-11 DIAGNOSIS — M5136 Other intervertebral disc degeneration, lumbar region: Secondary | ICD-10-CM | POA: Diagnosis not present

## 2023-02-11 DIAGNOSIS — M5416 Radiculopathy, lumbar region: Secondary | ICD-10-CM | POA: Diagnosis not present

## 2023-02-16 DIAGNOSIS — I10 Essential (primary) hypertension: Secondary | ICD-10-CM | POA: Diagnosis not present

## 2023-02-16 DIAGNOSIS — R7303 Prediabetes: Secondary | ICD-10-CM | POA: Diagnosis not present

## 2023-02-16 DIAGNOSIS — Z955 Presence of coronary angioplasty implant and graft: Secondary | ICD-10-CM | POA: Diagnosis not present

## 2023-02-16 DIAGNOSIS — E782 Mixed hyperlipidemia: Secondary | ICD-10-CM | POA: Diagnosis not present

## 2023-02-16 DIAGNOSIS — I272 Pulmonary hypertension, unspecified: Secondary | ICD-10-CM | POA: Diagnosis not present

## 2023-02-16 DIAGNOSIS — I25118 Atherosclerotic heart disease of native coronary artery with other forms of angina pectoris: Secondary | ICD-10-CM | POA: Diagnosis not present

## 2023-02-16 DIAGNOSIS — I700 Atherosclerosis of aorta: Secondary | ICD-10-CM | POA: Diagnosis not present

## 2023-02-16 DIAGNOSIS — E1151 Type 2 diabetes mellitus with diabetic peripheral angiopathy without gangrene: Secondary | ICD-10-CM | POA: Diagnosis not present

## 2023-03-10 ENCOUNTER — Ambulatory Visit (AMBULATORY_SURGERY_CENTER): Payer: No Typology Code available for payment source

## 2023-03-10 ENCOUNTER — Encounter: Payer: Self-pay | Admitting: Internal Medicine

## 2023-03-10 VITALS — Ht 67.0 in | Wt 185.0 lb

## 2023-03-10 DIAGNOSIS — Z86010 Personal history of colonic polyps: Secondary | ICD-10-CM

## 2023-03-10 MED ORDER — NA SULFATE-K SULFATE-MG SULF 17.5-3.13-1.6 GM/177ML PO SOLN: 1.0000 | Freq: Once | ORAL | 0 refills | Status: AC

## 2023-03-10 NOTE — Progress Notes (Signed)

## 2023-03-24 ENCOUNTER — Encounter: Payer: Self-pay | Admitting: Internal Medicine

## 2023-03-24 ENCOUNTER — Ambulatory Visit (AMBULATORY_SURGERY_CENTER): Payer: No Typology Code available for payment source | Admitting: Internal Medicine

## 2023-03-24 VITALS — BP 112/61 | HR 52 | Temp 98.3°F | Resp 14 | Ht 67.0 in | Wt 187.0 lb

## 2023-03-24 DIAGNOSIS — I10 Essential (primary) hypertension: Secondary | ICD-10-CM | POA: Diagnosis not present

## 2023-03-24 DIAGNOSIS — D123 Benign neoplasm of transverse colon: Secondary | ICD-10-CM

## 2023-03-24 DIAGNOSIS — Z1211 Encounter for screening for malignant neoplasm of colon: Secondary | ICD-10-CM | POA: Diagnosis not present

## 2023-03-24 DIAGNOSIS — Z09 Encounter for follow-up examination after completed treatment for conditions other than malignant neoplasm: Secondary | ICD-10-CM | POA: Diagnosis not present

## 2023-03-24 DIAGNOSIS — Z86010 Personal history of colonic polyps: Secondary | ICD-10-CM | POA: Diagnosis not present

## 2023-03-24 DIAGNOSIS — D124 Benign neoplasm of descending colon: Secondary | ICD-10-CM

## 2023-03-24 DIAGNOSIS — I2510 Atherosclerotic heart disease of native coronary artery without angina pectoris: Secondary | ICD-10-CM | POA: Diagnosis not present

## 2023-03-24 DIAGNOSIS — D122 Benign neoplasm of ascending colon: Secondary | ICD-10-CM

## 2023-03-24 DIAGNOSIS — E669 Obesity, unspecified: Secondary | ICD-10-CM | POA: Diagnosis not present

## 2023-03-24 MED ORDER — SODIUM CHLORIDE 0.9 % IV SOLN: 500.0000 mL | INTRAVENOUS | Status: DC

## 2023-03-24 NOTE — Patient Instructions (Signed)
Handouts Provided:  Polyps  YOU HAD AN ENDOSCOPIC PROCEDURE TODAY AT [facility]:   Refer to the procedure report that was given to you for any specific questions about what was found during the examination.  If the procedure report does not answer your questions, please call your gastroenterologist to clarify.  If you requested that your care partner not be given the details of your procedure findings, then the procedure report has been included in a sealed envelope for you to review at your convenience later.  YOU SHOULD EXPECT: Some feelings of bloating in the abdomen. Passage of more gas than usual.  Walking can help get rid of the air that was put into your GI tract during the procedure and reduce the bloating. If you had a lower endoscopy (such as a colonoscopy or flexible sigmoidoscopy) you may notice spotting of blood in your stool or on the toilet paper. If you underwent a bowel prep for your procedure, you may not have a normal bowel movement for a few days.  Please Note:  You might notice some irritation and congestion in your nose or some drainage.  This is from the oxygen used during your procedure.  There is no need for concern and it should clear up in a day or so.  SYMPTOMS TO REPORT IMMEDIATELY:  Following lower endoscopy (colonoscopy or flexible sigmoidoscopy):  Excessive amounts of blood in the stool  Significant tenderness or worsening of abdominal pains  Swelling of the abdomen that is new, acute  Fever of 100F or higher  For urgent or emergent issues, a gastroenterologist can be reached at any hour by calling [phone]. Do not use MyChart messaging for urgent concerns.    DIET:  We do recommend a small meal at first, but then you may proceed to your regular diet.  Drink plenty of fluids but you should avoid alcoholic beverages for 24 hours.  ACTIVITY:  You should plan to take it easy for the rest of today and you should NOT DRIVE or use heavy  machinery until tomorrow (because of the sedation medicines used during the test).    FOLLOW UP: Our staff will call the number listed on your records the next business day following your procedure.  We will call around 7:15- 8:00 am to check on you and address any questions or concerns that you may have regarding the information given to you following your procedure. If we do not reach you, we will leave a message.     If any biopsies were taken you will be contacted by phone or by letter within the next 1-3 weeks.  Please call us at [phone] if you have not heard about the biopsies in 3 weeks.    SIGNATURES/CONFIDENTIALITY: You and/or your care partner have signed paperwork which will be entered into your electronic medical record.  These signatures attest to the fact that that the information above on your After Visit Summary has been reviewed and is understood.  Full responsibility of the confidentiality of this discharge information lies with you and/or your care-partner.

## 2023-03-24 NOTE — Progress Notes (Signed)
GASTROENTEROLOGY PROCEDURE H&P NOTE   Primary Care Physician: Danella Penton, MD    Reason for Procedure:  History of colon polyps  Plan:    Colonoscopy  Patient is appropriate for endoscopic procedure(s) in the ambulatory ([facility]) setting.  The nature of the procedure, as well as the risks, benefits, and alternatives were carefully and thoroughly reviewed with the patient. Ample time for discussion and questions allowed. The patient understood, was satisfied, and agreed to proceed.     HPI: Richard Webster is a 67 y.o. male who presents for surveillance colonoscopy.  Medical history as below.  Tolerated the prep.  No recent chest pain or shortness of breath.  No abdominal pain today.  Past Medical History:  Diagnosis Date   Anxiety    pt denies   Arthritis    Atypical chest pain    BPH (benign prostatic hyperplasia)    Chronic kidney disease    kidney stones   H/O rheumatic heart disease    Heart murmur    History of right bundle branch block (RBBB)    Hyperlipidemia    Hypertension    Palpitations    Prostatitis    Rheumatic fever     Past Surgical History:  Procedure Laterality Date   COLONOSCOPY     LITHOTRIPSY     x2 for kidney stones   POLYPECTOMY     RIGHT/LEFT HEART CATH AND CORONARY ANGIOGRAPHY N/A 11/12/2019   Procedure: RIGHT/LEFT HEART CATH AND CORONARY ANGIOGRAPHY;  Surgeon: Alwyn Pea, MD;  Location: [facility] INVASIVE CV LAB;  Service: Cardiovascular;  Laterality: N/A;    Prior to Admission medications   Medication Sig Start Date End Date Taking? Authorizing Provider  aspirin EC 81 MG tablet Take 81 mg by mouth daily.   Yes [provider]  olmesartan (BENICAR) 20 MG tablet Take by mouth.   Yes [provider]  pantoprazole (PROTONIX) 40 MG tablet Take 40 mg by mouth daily. 09/17/21  Yes [provider]  rosuvastatin (CRESTOR) 20 MG tablet Take by mouth. 10/01/22  Yes [provider]  naproxen sodium (ALEVE)  220 MG tablet Take 220 mg by mouth daily as needed (pain). Patient not taking: Reported on 03/10/2023    [provider]  nitroGLYCERIN (NITROSTAT) 0.4 MG SL tablet Place under the tongue. 12/11/19 12/10/20  [provider]  Ruxolitinib Phosphate (OPZELURA) 1.5 % CREA Apply 1 application topically daily. Patient not taking: Reported on 05/19/2022 09/21/21   Deirdre Evener, MD    Current Outpatient Medications  Medication Sig Dispense Refill   aspirin EC 81 MG tablet Take 81 mg by mouth daily.     olmesartan (BENICAR) 20 MG tablet Take by mouth.     pantoprazole (PROTONIX) 40 MG tablet Take 40 mg by mouth daily.     rosuvastatin (CRESTOR) 20 MG tablet Take by mouth.     naproxen sodium (ALEVE) 220 MG tablet Take 220 mg by mouth daily as needed (pain). (Patient not taking: Reported on 03/10/2023)     nitroGLYCERIN (NITROSTAT) 0.4 MG SL tablet Place under the tongue.     Ruxolitinib Phosphate (OPZELURA) 1.5 % CREA Apply 1 application topically daily. (Patient not taking: Reported on 05/19/2022) 60 g 3   Current Facility-Administered Medications  Medication Dose Route Frequency Provider Last Rate Last Admin   0.9 %  sodium chloride infusion  500 mL Intravenous Continuous Honour Schwieger, Carie Caddy, MD        Allergies as of 03/24/2023   (  No Known Allergies)    Family History  Problem Relation Age of Onset   Stroke Mother    Dementia Mother    Colon cancer Neg Hx    Rectal cancer Neg Hx    Stomach cancer Neg Hx    Colon polyps Neg Hx    Esophageal cancer Neg Hx     Social History   Socioeconomic History   Marital status: Married    Spouse name: Not on file   Number of children: Not on file   Years of education: Not on file   Highest education level: Not on file  Occupational History   Not on file  Tobacco Use   Smoking status: Former    Current packs/day: 0.00    Average packs/day: 0.5 packs/day for 15.0 years (7.5 ttl pk-yrs)    Types: Cigarettes    Start date:  09/03/1968    Quit date: 09/04/1983    Years since quitting: 39.5   Smokeless tobacco: Former  Building services engineer status: Never Used  Substance and Sexual Activity   Alcohol use: Yes    Comment: very rare   Drug use: No   Sexual activity: Yes    Birth control/protection: None  Other Topics Concern   Not on file  Social History Narrative   Not on file   Social Determinants of Health   Financial Resource Strain: Low Risk  (01/10/2023)   Received from [facility], [facility]   Overall Financial Resource Strain (CARDIA)    Difficulty of Paying Living Expenses: Not hard at all  Food Insecurity: No Food Insecurity (01/10/2023)   Received from [facility], [facility]   Hunger Vital Sign    Worried About Running Out of Food in the Last Year: Never true    Ran Out of Food in the Last Year: Never true  Transportation Needs: No Transportation Needs (01/10/2023)   Received from [facility], [facility]   PRAPARE - Transportation    In the past 12 months, has lack of transportation kept you from medical appointments or from getting medications?: No    Lack of Transportation (Non-Medical): No  Physical Activity: Not on file  Stress: Not on file  Social Connections: Not on file  Intimate Partner Violence: Not on file    Physical Exam: Vital signs in last 24 hours: @BP  138/69   Pulse 61   Temp 98.3 F (36.8 C) (Temporal)   Ht 5\' 7"  (1.702 m)   Wt 187 lb (84.8 kg)   SpO2 98%   BMI 29.29 kg/m  GEN: NAD EYE: Sclerae anicteric ENT: MMM CV: Non-tachycardic Pulm: CTA b/l GI: Soft, NT/ND NEURO:  Alert & Oriented x 3   Erick Blinks, MD [facility]  03/24/2023 2:01 PM

## 2023-03-24 NOTE — Progress Notes (Signed)
Called to room to assist during endoscopic procedure.  Patient ID and intended procedure confirmed with present staff. Received instructions for my participation in the procedure from the performing physician.  

## 2023-03-24 NOTE — Op Note (Signed)
[facility] Patient Name: Richard Webster Procedure Date: 03/24/2023 2:00 PM MRN: [id number] Endoscopist: Beverley Fiedler , MD, [id number] Age: 67 Referring MD:  Date of Birth: 01-Jan-1956 Gender: Male Account #: [id number] Procedure:                Colonoscopy Indications:              High risk colon cancer surveillance: Personal                            history of multiple adenomas, Last colonoscopy:                            December 2018 (5 adenomas); Nov 2015 (3 adenomas) Medicines:                Monitored Anesthesia Care Procedure:                Pre-Anesthesia Assessment:                           - Prior to the procedure, a History and Physical                            was performed, and patient medications and                            allergies were reviewed. The patient's tolerance of                            previous anesthesia was also reviewed. The risks                            and benefits of the procedure and the sedation                            options and risks were discussed with the patient.                            All questions were answered, and informed consent                            was obtained. Prior Anticoagulants: The patient has                            taken no anticoagulant or antiplatelet agents. ASA                            Grade Assessment: II - A patient with mild systemic                            disease. After reviewing the risks and benefits,                            the patient was deemed in satisfactory condition to  undergo the procedure.                           After obtaining informed consent, the colonoscope                            was passed under direct vision. Throughout the                            procedure, the patient's blood pressure, pulse, and                            oxygen saturations were monitored continuously. The                            Olympus Scope  [id number] was introduced through the                            anus and advanced to the cecum, identified by                            appendiceal orifice and ileocecal valve. The                            colonoscopy was performed without difficulty. The                            patient tolerated the procedure well. The quality                            of the bowel preparation was good. The ileocecal                            valve, appendiceal orifice, and rectum were                            photographed. Scope In: 2:11:56 PM Scope Out: 2:27:36 PM Scope Withdrawal Time: 0 hours 14 minutes 26 seconds  Total Procedure Duration: 0 hours 15 minutes 40 seconds  Findings:                 The digital rectal exam was normal.                           Three sessile polyps were found in the ascending                            colon. The polyps were 4 to 7 mm in size. These                            polyps were removed with a cold snare. Resection                            and retrieval were complete.  Three sessile polyps were found in the transverse                            colon. The polyps were 4 to 6 mm in size. These                            polyps were removed with a cold snare. Resection                            and retrieval were complete.                           A 5 mm polyp was found in the descending colon. The                            polyp was sessile. The polyp was removed with a                            cold snare. Resection and retrieval were complete.                           Internal hemorrhoids were found during                            retroflexion. The hemorrhoids were small. Complications:            No immediate complications. Estimated Blood Loss:     Estimated blood loss was minimal. Impression:               - Three 4 to 7 mm polyps in the ascending colon,                            removed with a cold snare.  Resected and retrieved.                           - Three 4 to 6 mm polyps in the transverse colon,                            removed with a cold snare. Resected and retrieved.                           - One 5 mm polyp in the descending colon, removed                            with a cold snare. Resected and retrieved.                           - Small internal hemorrhoids. Recommendation:           - Patient has a contact number available for                            emergencies. The signs and symptoms of potential  delayed complications were discussed with the                            patient. Return to normal activities tomorrow.                            Written discharge instructions were provided to the                            patient.                           - Resume previous diet.                           - Continue present medications.                           - Await pathology results.                           - Repeat colonoscopy is recommended for                            surveillance. The colonoscopy date will be                            determined after pathology results from today's                            exam become available for review. Beverley Fiedler, MD 03/24/2023 2:35:14 PM This report has been signed electronically.

## 2023-03-24 NOTE — Progress Notes (Signed)
Report to PACU, RN, vss, BBS= Clear.  

## 2023-03-25 ENCOUNTER — Telehealth: Payer: Self-pay

## 2023-03-25 NOTE — Telephone Encounter (Signed)
  Follow up Call-     03/24/2023    1:35 PM  Call back number  Post procedure Call Back phone  # [phone]  Permission to leave phone message Yes     Patient questions:  Do you have a fever, pain , or abdominal swelling? No. Pain Score  0 *  Have you tolerated food without any problems? Yes.    Have you been able to return to your normal activities? Yes.    Do you have any questions about your discharge instructions: Diet   No. Medications  No. Follow up visit  No.  Do you have questions or concerns about your Care? No.  Actions: * If pain score is 4 or above: No action needed, pain <4.

## 2023-03-30 DIAGNOSIS — M5416 Radiculopathy, lumbar region: Secondary | ICD-10-CM | POA: Diagnosis not present

## 2023-03-30 DIAGNOSIS — M48062 Spinal stenosis, lumbar region with neurogenic claudication: Secondary | ICD-10-CM | POA: Diagnosis not present

## 2023-04-05 ENCOUNTER — Encounter: Payer: Self-pay | Admitting: Internal Medicine

## 2023-04-06 DIAGNOSIS — R972 Elevated prostate specific antigen [PSA]: Secondary | ICD-10-CM | POA: Diagnosis not present

## 2023-04-06 DIAGNOSIS — E782 Mixed hyperlipidemia: Secondary | ICD-10-CM | POA: Diagnosis not present

## 2023-04-06 DIAGNOSIS — E1151 Type 2 diabetes mellitus with diabetic peripheral angiopathy without gangrene: Secondary | ICD-10-CM | POA: Diagnosis not present

## 2023-04-12 DIAGNOSIS — I10 Essential (primary) hypertension: Secondary | ICD-10-CM | POA: Diagnosis not present

## 2023-04-12 DIAGNOSIS — E782 Mixed hyperlipidemia: Secondary | ICD-10-CM | POA: Diagnosis not present

## 2023-04-12 DIAGNOSIS — Z23 Encounter for immunization: Secondary | ICD-10-CM | POA: Diagnosis not present

## 2023-04-12 DIAGNOSIS — Z125 Encounter for screening for malignant neoplasm of prostate: Secondary | ICD-10-CM | POA: Diagnosis not present

## 2023-04-12 DIAGNOSIS — Z2821 Immunization not carried out because of patient refusal: Secondary | ICD-10-CM | POA: Diagnosis not present

## 2023-04-12 DIAGNOSIS — E1151 Type 2 diabetes mellitus with diabetic peripheral angiopathy without gangrene: Secondary | ICD-10-CM | POA: Diagnosis not present

## 2023-04-20 DIAGNOSIS — M5416 Radiculopathy, lumbar region: Secondary | ICD-10-CM | POA: Diagnosis not present

## 2023-04-20 DIAGNOSIS — M48062 Spinal stenosis, lumbar region with neurogenic claudication: Secondary | ICD-10-CM | POA: Diagnosis not present

## 2023-05-11 ENCOUNTER — Ambulatory Visit
Admission: RE | Admit: 2023-05-11 | Discharge: 2023-05-11 | Disposition: A | Discharge status: Home / Self Care | Payer: No Typology Code available for payment source | Source: Ambulatory Visit | Attending: Internal Medicine | Admitting: Internal Medicine

## 2023-05-11 ENCOUNTER — Other Ambulatory Visit: Payer: Self-pay | Admitting: Internal Medicine

## 2023-05-11 DIAGNOSIS — R1012 Left upper quadrant pain: Secondary | ICD-10-CM | POA: Insufficient documentation

## 2023-05-11 DIAGNOSIS — N200 Calculus of kidney: Secondary | ICD-10-CM | POA: Diagnosis not present

## 2023-05-11 DIAGNOSIS — R109 Unspecified abdominal pain: Secondary | ICD-10-CM | POA: Diagnosis not present

## 2023-05-11 DIAGNOSIS — N281 Cyst of kidney, acquired: Secondary | ICD-10-CM | POA: Diagnosis not present

## 2023-05-11 MED ORDER — IOHEXOL 300 MG/ML  SOLN
100.0000 mL | Freq: Once | INTRAMUSCULAR | Status: AC | PRN
  Administered 2023-05-11: 100 mL via INTRAVENOUS

## 2023-05-23 DIAGNOSIS — N200 Calculus of kidney: Secondary | ICD-10-CM | POA: Diagnosis not present

## 2023-05-23 DIAGNOSIS — N23 Unspecified renal colic: Secondary | ICD-10-CM | POA: Diagnosis not present

## 2023-05-23 DIAGNOSIS — E1151 Type 2 diabetes mellitus with diabetic peripheral angiopathy without gangrene: Secondary | ICD-10-CM | POA: Diagnosis not present

## 2023-06-16 DIAGNOSIS — Z6829 Body mass index (BMI) 29.0-29.9, adult: Secondary | ICD-10-CM | POA: Diagnosis not present

## 2023-06-16 DIAGNOSIS — F17211 Nicotine dependence, cigarettes, in remission: Secondary | ICD-10-CM | POA: Diagnosis not present

## 2023-06-16 DIAGNOSIS — E663 Overweight: Secondary | ICD-10-CM | POA: Diagnosis not present

## 2023-06-16 DIAGNOSIS — E785 Hyperlipidemia, unspecified: Secondary | ICD-10-CM | POA: Diagnosis not present

## 2023-06-16 DIAGNOSIS — Z008 Encounter for other general examination: Secondary | ICD-10-CM | POA: Diagnosis not present

## 2023-06-16 DIAGNOSIS — I10 Essential (primary) hypertension: Secondary | ICD-10-CM | POA: Diagnosis not present

## 2023-06-16 DIAGNOSIS — N200 Calculus of kidney: Secondary | ICD-10-CM | POA: Diagnosis not present

## 2023-06-16 DIAGNOSIS — K219 Gastro-esophageal reflux disease without esophagitis: Secondary | ICD-10-CM | POA: Diagnosis not present

## 2023-06-27 ENCOUNTER — Other Ambulatory Visit: Payer: Self-pay | Admitting: Urology

## 2023-06-28 NOTE — Progress Notes (Signed)
Pre Procedure Litho Phone Call Attempted to contact client to provide information for upcoming scheduled Litho procedure, no answer, was unable to leave message, will attempt to contact client tomorrow by phone.

## 2023-06-29 NOTE — Progress Notes (Signed)
Talked with patient. Instructions given. Hx and meds reviewed. Arrival time 63. Npo after MN except sip of water with AM meds. Bring iln blue folder. Wife is the driver

## 2023-06-30 NOTE — H&P (Signed)
Office Visit Report     06/16/2023   --------------------------------------------------------------------------------   Richard Webster  MRN: [id number]  DOB: [birth year], 67 year old Male  SSN:    PRIMARY CARE:     REFERRING:  Bethann Punches, MD  PROVIDER:  Modena Slater, Radene Knee, M.D.  LOCATION:  [facility] [phone]     --------------------------------------------------------------------------------   CC/HPI: CC: Left renal calculus  HPI:  06/16/2023  67 year old male with history nephrolithiasis has required ESWL and ureteroscopy in the past. Has been having intermittent left-sided abdominal pain and flank pain. CT scan was performed that showed a 1 cm left renal pelvic calculus. No hydronephrosis. Patient continues to have some intermittent left-sided abdominal pain that is bothersome. Denies any fever, chill, nausea, vomiting.     ALLERGIES: No Known Drug Allergies    MEDICATIONS: Aspirin  Etodolac  Gabapentin  Nitroglycerin  Olmesartan Medoxomil  Rosuvastatin Calcium     GU PSH: None   NON-GU PSH: None   GU PMH: None   NON-GU PMH: Arthritis GERD Heart disease, unspecified Hypercholesterolemia Hypertension    FAMILY HISTORY: 1 Daughter - Other 1 son - Other nephrolithiasis - Father   SOCIAL HISTORY: Marital Status: Married Preferred Language: English; Race: White Current Smoking Status: Patient does not smoke anymore. Has not smoked since 05/26/1982.   Tobacco Use Assessment Completed: Used Tobacco in last 30 days? Drinks 2 caffeinated drinks per day.    REVIEW OF SYSTEMS:    GU Review Male:   Patient reports frequent urination, hard to postpone urination, get up at night to urinate, and erection problems. Patient denies burning/ pain with urination, leakage of urine, stream starts and stops, trouble starting your stream, have to strain to urinate , and penile pain.  Gastrointestinal (Upper):   Patient denies nausea, vomiting, and  indigestion/ heartburn.  Gastrointestinal (Lower):   Patient denies diarrhea and constipation.  Constitutional:   Patient denies fever, night sweats, weight loss, and fatigue.  Skin:   Patient denies skin rash/ lesion and itching.  Eyes:   Patient denies blurred vision and double vision.  Ears/ Nose/ Throat:   Patient denies sore throat and sinus problems.  Hematologic/Lymphatic:   Patient denies easy bruising and swollen glands.  Cardiovascular:   Patient denies leg swelling and chest pains.  Respiratory:   Patient denies cough and shortness of breath.  Endocrine:   Patient denies excessive thirst.  Musculoskeletal:   Patient denies back pain and joint pain.  Neurological:   Patient denies headaches and dizziness.  Psychologic:   Patient denies depression and anxiety.   VITAL SIGNS: None   MULTI-SYSTEM PHYSICAL EXAMINATION:    Constitutional: Well-nourished. No physical deformities. Normally developed. Good grooming.  Respiratory: No labored breathing, no use of accessory muscles.   Cardiovascular: Normal temperature, normal extremity pulses, no swelling, no varicosities.  Skin: No paleness, no jaundice, no cyanosis. No lesion, no ulcer, no rash.  Neurologic / Psychiatric: Oriented to time, oriented to place, oriented to person. No depression, no anxiety, no agitation.  Gastrointestinal: No mass, no tenderness, no rigidity, non obese abdomen.  Eyes: Normal conjunctivae. Normal eyelids.  Musculoskeletal: Normal gait and station of head and neck.     Complexity of Data:  Source Of History:  Patient  Records Review:   Previous Doctor Records, Previous Patient Records  Urine Test Review:   Urinalysis  X-Ray Review: C.T. Abdomen/Pelvis: Reviewed Films. Reviewed Report. Discussed With Patient.     PROCEDURES:  Urinalysis w/Scope - 81001 Dipstick Dipstick Cont'd Micro  Color: Yellow Bilirubin: Neg WBC/hpf: NS (Not Seen)  Appearance: Clear Ketones: Neg RBC/hpf: 3 - 10/hpf   Specific Gravity: 1.025 Blood: 2+ Bacteria: Few (10-25/hpf)  pH: <=5.0 Protein: Neg Cystals: Ca Oxalate  Glucose: Neg Urobilinogen: 0.2 Casts: NS (Not Seen)    Nitrites: Neg Trichomonas: Not Present    Leukocyte Esterase: Neg Mucous: Present      Epithelial Cells: 0 - 5/hpf      Yeast: NS (Not Seen)      Sperm: Not Present    Notes:  **Verified by repeat analysis**     ASSESSMENT:      ICD-10 Details  1 GU:   Renal calculus - N20.0 Undiagnosed New Problem  2   Flank Pain - R10.84 Undiagnosed New Problem   PLAN:           Orders Labs Urine Culture          Document Letter(s):  Created for Patient: Clinical Summary         Notes:   We discussed the management of urinary stones. These options include observation, ureteroscopy, and shockwave lithotripsy. We discussed which options are relevant to these particular stones. We discussed the natural history of stones as well as the complications of untreated stones and the impact on quality of life without treatment as well as with each of the above listed treatments. We also discussed the efficacy of each treatment in its ability to clear the stone burden. With any of these management options I discussed the signs and symptoms of infection and the need for emergent treatment should these be experienced. For each option we discussed the ability of each procedure to clear the patient of their stone burden.   For observation I described the risks which include but are not limited to silent renal damage, life-threatening infection, need for emergent surgery, failure to pass stone, and pain.   For ureteroscopy I described the risks which include heart attack, stroke, pulmonary embolus, death, bleeding, infection, damage to contiguous structures, positioning injury, ureteral stricture, ureteral avulsion, ureteral injury, need for ureteral stent, inability to perform ureteroscopy, need for an interval procedure, inability to clear stone burden,  stent discomfort and pain.   For shockwave lithotripsy I described the risks which include arrhythmia, kidney contusion, kidney hemorrhage, need for transfusion, pain, inability to break up stone, inability to pass stone fragments, Steinstrasse, infection associated with obstructing stones, need for different surgical procedure, need for repeat shockwave lithotripsy.   Stone is visible on scout imaging. He would like to proceed with ESWL of the renal pelvic stone. He is on aspirin 81 mg and has a history of cardiac stent placement. He will need cardiology clearance to come off the aspirin. If unable to come off aspirin we will need to perform ureteroscopy instead. This was all discussed with him.   CC: Dr. Hyacinth Meeker          Next Appointment:      Next Appointment: 07/01/2023 08:45 AM    Appointment Type: Surgery     Location: [facility] [phone]    Provider: Jerilee Field, M.D.    Reason for Visit: OP--NE-LEFT ESWL      * Signed by Modena Slater, III, M.D. on 06/29/23 at 12:18 AM (EST*      ADD: Urine cx negative.

## 2023-07-01 ENCOUNTER — Other Ambulatory Visit: Payer: Self-pay

## 2023-07-01 ENCOUNTER — Ambulatory Visit (HOSPITAL_BASED_OUTPATIENT_CLINIC_OR_DEPARTMENT_OTHER)
Admission: RE | Admit: 2023-07-01 | Discharge: 2023-07-01 | Disposition: A | Discharge status: Home / Self Care | Payer: No Typology Code available for payment source | Attending: Urology | Admitting: Urology

## 2023-07-01 ENCOUNTER — Encounter (HOSPITAL_BASED_OUTPATIENT_CLINIC_OR_DEPARTMENT_OTHER): Payer: Self-pay | Admitting: Urology

## 2023-07-01 ENCOUNTER — Ambulatory Visit (HOSPITAL_COMMUNITY): Payer: No Typology Code available for payment source

## 2023-07-01 ENCOUNTER — Encounter (HOSPITAL_BASED_OUTPATIENT_CLINIC_OR_DEPARTMENT_OTHER)
Admission: RE | Disposition: A | Discharge status: Home / Self Care | Payer: Self-pay | Source: Home / Self Care | Attending: Urology

## 2023-07-01 DIAGNOSIS — Z955 Presence of coronary angioplasty implant and graft: Secondary | ICD-10-CM | POA: Insufficient documentation

## 2023-07-01 DIAGNOSIS — N200 Calculus of kidney: Secondary | ICD-10-CM | POA: Insufficient documentation

## 2023-07-01 DIAGNOSIS — M1990 Unspecified osteoarthritis, unspecified site: Secondary | ICD-10-CM | POA: Diagnosis not present

## 2023-07-01 DIAGNOSIS — I878 Other specified disorders of veins: Secondary | ICD-10-CM | POA: Diagnosis not present

## 2023-07-01 HISTORY — PX: EXTRACORPOREAL SHOCK WAVE LITHOTRIPSY: SHX1557

## 2023-07-01 SURGERY — LITHOTRIPSY, ESWL
Anesthesia: LOCAL | Laterality: Left

## 2023-07-01 MED ORDER — TAMSULOSIN HCL 0.4 MG PO CAPS: 0.4000 mg | ORAL_CAPSULE | Freq: Every day | ORAL | 0 refills | Status: AC

## 2023-07-01 MED ORDER — SODIUM CHLORIDE 0.9 % IV SOLN: INTRAVENOUS | Status: DC

## 2023-07-01 MED ORDER — ASPIRIN EC 81 MG PO TBEC: 81.0000 mg | DELAYED_RELEASE_TABLET | Freq: Every day | ORAL | Status: AC

## 2023-07-01 MED ORDER — DIAZEPAM 5 MG PO TABS
10.0000 mg | ORAL_TABLET | ORAL | Status: AC
  Administered 2023-07-01: 10 mg via ORAL

## 2023-07-01 MED ORDER — CIPROFLOXACIN HCL 500 MG PO TABS
ORAL_TABLET | ORAL | Status: AC
  Filled 2023-07-01: qty 1

## 2023-07-01 MED ORDER — DIPHENHYDRAMINE HCL 25 MG PO CAPS
ORAL_CAPSULE | ORAL | Status: AC
  Filled 2023-07-01: qty 1

## 2023-07-01 MED ORDER — DIPHENHYDRAMINE HCL 25 MG PO CAPS
25.0000 mg | ORAL_CAPSULE | ORAL | Status: AC
  Administered 2023-07-01: 25 mg via ORAL

## 2023-07-01 MED ORDER — HYDROCODONE-ACETAMINOPHEN 5-325 MG PO TABS: 1.0000 | ORAL_TABLET | Freq: Four times a day (QID) | ORAL | 0 refills | Status: DC | PRN

## 2023-07-01 MED ORDER — CIPROFLOXACIN HCL 500 MG PO TABS
500.0000 mg | ORAL_TABLET | ORAL | Status: AC
  Administered 2023-07-01: 500 mg via ORAL

## 2023-07-01 MED ORDER — NAPROXEN SODIUM 220 MG PO TABS: 220.0000 mg | ORAL_TABLET | Freq: Every day | ORAL | Status: DC | PRN

## 2023-07-01 MED ORDER — DIAZEPAM 5 MG PO TABS
ORAL_TABLET | ORAL | Status: AC
  Filled 2023-07-01: qty 2

## 2023-07-01 NOTE — Op Note (Signed)
10 mm left renal pelvic stone   Left ESWL   Findings: pt moved a lot. Had to recouple. Then we were able to target the stone well. Stone fragmented well. Slowed the last 700 or so shocks. He may need a staged procedure of he fails to pass the stone or stone fragments.

## 2023-07-01 NOTE — Interval H&P Note (Signed)
History and Physical Interval Note:  07/01/2023 9:02 AM  Richard Webster  has presented today for surgery, with the diagnosis of LEFT RENAL STONE.  The various methods of treatment have been discussed with the patient and family. After consideration of risks, benefits and other options for treatment, the patient has consented to  Procedure(s): LEFT EXTRACORPOREAL SHOCK WAVE LITHOTRIPSY (ESWL) (Left) as a surgical intervention.  The patient's history has been reviewed, patient examined, no change in status, stable for surgery.  I have reviewed the patient's chart and labs.  Questions were answered to the patient's satisfaction.  Stone visible left renal pelvis. No fever or dysuria.    Jerilee Field

## 2023-07-02 ENCOUNTER — Emergency Department (HOSPITAL_COMMUNITY)
Admission: EM | Admit: 2023-07-02 | Discharge: 2023-07-02 | Disposition: A | Discharge status: Home / Self Care | Payer: No Typology Code available for payment source | Attending: Emergency Medicine | Admitting: Emergency Medicine

## 2023-07-02 ENCOUNTER — Emergency Department (HOSPITAL_COMMUNITY): Payer: No Typology Code available for payment source

## 2023-07-02 ENCOUNTER — Encounter (HOSPITAL_COMMUNITY): Payer: Self-pay

## 2023-07-02 ENCOUNTER — Other Ambulatory Visit: Payer: Self-pay

## 2023-07-02 DIAGNOSIS — N132 Hydronephrosis with renal and ureteral calculous obstruction: Secondary | ICD-10-CM | POA: Diagnosis not present

## 2023-07-02 DIAGNOSIS — N200 Calculus of kidney: Secondary | ICD-10-CM | POA: Diagnosis not present

## 2023-07-02 DIAGNOSIS — N23 Unspecified renal colic: Secondary | ICD-10-CM

## 2023-07-02 DIAGNOSIS — R109 Unspecified abdominal pain: Secondary | ICD-10-CM | POA: Diagnosis not present

## 2023-07-02 DIAGNOSIS — R112 Nausea with vomiting, unspecified: Secondary | ICD-10-CM | POA: Diagnosis not present

## 2023-07-02 DIAGNOSIS — K449 Diaphragmatic hernia without obstruction or gangrene: Secondary | ICD-10-CM | POA: Diagnosis not present

## 2023-07-02 LAB — URINALYSIS, W/ REFLEX TO CULTURE (INFECTION SUSPECTED)
Bacteria, UA: NONE SEEN
Bilirubin Urine: NEGATIVE
Glucose, UA: >=500 mg/dL — AB
Ketones, ur: NEGATIVE mg/dL
Leukocytes,Ua: NEGATIVE
Nitrite: NEGATIVE
Protein, ur: 100 mg/dL — AB
RBC / HPF: >50 RBC/hpf (ref 0–5)
Specific Gravity, Urine: 1.027 (ref 1.005–1.030)
pH: 5 (ref 5.0–8.0)

## 2023-07-02 LAB — CBC WITH DIFFERENTIAL/PLATELET
Abs Immature Granulocytes: 0.03 10*3/uL (ref 0.00–0.07)
Basophils Absolute: 0 10*3/uL (ref 0.0–0.1)
Basophils Relative: 0 %
Eosinophils Absolute: 0 10*3/uL (ref 0.0–0.5)
Eosinophils Relative: 0 %
HCT: 42.9 % (ref 39.0–52.0)
Hemoglobin: 15 g/dL (ref 13.0–17.0)
Immature Granulocytes: 0 %
Lymphocytes Relative: 4 %
Lymphs Abs: 0.4 10*3/uL — ABNORMAL LOW (ref 0.7–4.0)
MCH: 30.8 pg (ref 26.0–34.0)
MCHC: 35 g/dL (ref 30.0–36.0)
MCV: 88.1 fL (ref 80.0–100.0)
Monocytes Absolute: 0.4 10*3/uL (ref 0.1–1.0)
Monocytes Relative: 3 %
Neutro Abs: 10.3 10*3/uL — ABNORMAL HIGH (ref 1.7–7.7)
Neutrophils Relative %: 93 %
Platelets: 169 10*3/uL (ref 150–400)
RBC: 4.87 MIL/uL (ref 4.22–5.81)
RDW: 12.5 % (ref 11.5–15.5)
WBC: 11.2 10*3/uL — ABNORMAL HIGH (ref 4.0–10.5)
nRBC: 0 % (ref 0.0–0.2)

## 2023-07-02 LAB — BASIC METABOLIC PANEL
Anion gap: 11 (ref 5–15)
BUN: 18 mg/dL (ref 8–23)
CO2: 28 mmol/L (ref 22–32)
Calcium: 9.1 mg/dL (ref 8.9–10.3)
Chloride: 96 mmol/L — ABNORMAL LOW (ref 98–111)
Creatinine, Ser: 1.08 mg/dL (ref 0.61–1.24)
GFR, Estimated: >60 mL/min (ref >60)
Glucose, Bld: 270 mg/dL — ABNORMAL HIGH (ref 70–99)
Potassium: 2.7 mmol/L — CL (ref 3.5–5.1)
Sodium: 135 mmol/L (ref 135–145)

## 2023-07-02 MED ORDER — KETOROLAC TROMETHAMINE 10 MG PO TABS: 10.0000 mg | ORAL_TABLET | Freq: Four times a day (QID) | ORAL | 0 refills | Status: AC | PRN

## 2023-07-02 MED ORDER — HYDROMORPHONE HCL 1 MG/ML IJ SOLN
1.0000 mg | Freq: Once | INTRAMUSCULAR | Status: AC
  Administered 2023-07-02: 1 mg via INTRAVENOUS
  Filled 2023-07-02: qty 1

## 2023-07-02 MED ORDER — ONDANSETRON HCL 4 MG/2ML IJ SOLN
4.0000 mg | Freq: Once | INTRAMUSCULAR | Status: AC
  Administered 2023-07-02: 4 mg via INTRAVENOUS
  Filled 2023-07-02: qty 2

## 2023-07-02 MED ORDER — MAGNESIUM SULFATE 2 GM/50ML IV SOLN
2.0000 g | Freq: Once | INTRAVENOUS | Status: AC
  Administered 2023-07-02: 2 g via INTRAVENOUS
  Filled 2023-07-02: qty 50

## 2023-07-02 MED ORDER — OXYCODONE-ACETAMINOPHEN 5-325 MG PO TABS
1.0000 | ORAL_TABLET | Freq: Once | ORAL | Status: AC
Start: 2023-07-02 — End: 2023-07-02
  Administered 2023-07-02: 1 via ORAL
  Filled 2023-07-02: qty 1

## 2023-07-02 MED ORDER — POTASSIUM CHLORIDE 10 MEQ/100ML IV SOLN
10.0000 meq | INTRAVENOUS | Status: AC
Start: 2023-07-02 — End: 2023-07-02
  Administered 2023-07-02 (×2): 10 meq via INTRAVENOUS
  Filled 2023-07-02 (×2): qty 100

## 2023-07-02 MED ORDER — ONDANSETRON 4 MG PO TBDP: 4.0000 mg | ORAL_TABLET | Freq: Three times a day (TID) | ORAL | 0 refills | Status: AC | PRN

## 2023-07-02 MED ORDER — OXYCODONE-ACETAMINOPHEN 5-325 MG PO TABS: 1.0000 | ORAL_TABLET | Freq: Four times a day (QID) | ORAL | 0 refills | Status: AC | PRN

## 2023-07-02 MED ORDER — SODIUM CHLORIDE 0.9 % IV BOLUS
500.0000 mL | Freq: Once | INTRAVENOUS | Status: AC
Start: 2023-07-02 — End: 2023-07-02
  Administered 2023-07-02: 500 mL via INTRAVENOUS

## 2023-07-02 MED ORDER — KETOROLAC TROMETHAMINE 15 MG/ML IJ SOLN
15.0000 mg | Freq: Once | INTRAMUSCULAR | Status: AC
  Administered 2023-07-02: 15 mg via INTRAVENOUS
  Filled 2023-07-02: qty 1

## 2023-07-02 NOTE — Progress Notes (Signed)
41 M s/p ESWL on L side on Friday 12/6. Returned to ER with pain. Pain improved in with medications. But patient still tachycardic. Discussed placing stent in the AM but patient was very hesitant about this. HE stated that he felt fine and would like to go home. Reviewed risks of obstruction, patient stated he would prefer to go home with low threshold to return to ER.

## 2023-07-02 NOTE — ED Notes (Signed)
Urologist at bedside to evaluate. Patient in no distress. Patient wants to go home and see if he can manage.

## 2023-07-02 NOTE — Consult Note (Cosign Needed)
Urology Consult Note   Requesting Attending Physician:  Derwood Kaplan, MD Service Providing Consult: Urology  Consulting Attending: Rhunette Croft  Reason for Consult:  s/p ESWL with obstructing left proximal stone  HPI: Richard Webster is seen in consultation for reasons noted above at the request of Derwood Kaplan, MD   This is a 68 y.o. male with past medical history significant for BPH, CKD, hypertension lipidemia, hypertension, and nephrolithiasis status post ESWL on 07/01/2023.  Patient presented to the ED with excruciating left flank pain earlier this morning.  Patient states that he has not had any fevers or chills, but the flank pain is unrelenting, and he is unable to sit or lie down.  He states he has been taking the Tylenol and the Flomax.  Was able to sleep well last night, and was able to void without issue this morning, but increasingly noticed pain throughout the morning.  He is borderline tachycardic in the emergency department, and hypertensive.  He is satting well on room air  Labs are currently pending.  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  Past Medical History: Past Medical History:  Diagnosis Date   Anxiety    pt denies   Arthritis    Atypical chest pain    BPH (benign prostatic hyperplasia)    Chronic kidney disease    kidney stones   H/O rheumatic heart disease    Heart murmur    History of right bundle branch block (RBBB)    Hyperlipidemia    Hypertension    Palpitations    Prostatitis    Rheumatic fever     Past Surgical History:  Past Surgical History:  Procedure Laterality Date   COLONOSCOPY     LITHOTRIPSY     x2 for kidney stones   POLYPECTOMY     RIGHT/LEFT HEART CATH AND CORONARY ANGIOGRAPHY N/A 11/12/2019   Procedure: RIGHT/LEFT HEART CATH AND CORONARY ANGIOGRAPHY;  Surgeon: Alwyn Pea, MD;  Location: [facility] INVASIVE CV [facility];  Service: Cardiovascular;  Laterality: N/A;     Medication: No current facility-administered medications for this encounter.   Current Outpatient Medications  Medication Sig Dispense Refill   [START ON 07/03/2023] aspirin EC 81 MG tablet Take 1 tablet (81 mg total) by mouth daily.     HYDROcodone-acetaminophen (NORCO/VICODIN) 5-325 MG tablet Take 1 tablet by mouth every 6 (six) hours as needed for moderate pain (pain score 4-6). 15 tablet 0   [START ON 07/04/2023] naproxen sodium (ALEVE) 220 MG tablet Take 1 tablet (220 mg total) by mouth daily as needed (pain).     nitroGLYCERIN (NITROSTAT) 0.4 MG SL tablet Place under the tongue.     olmesartan (BENICAR) 20 MG tablet Take by mouth.     pantoprazole (PROTONIX) 40 MG tablet Take 40 mg by mouth daily.     rosuvastatin (CRESTOR) 20 MG tablet Take by mouth.     tamsulosin (FLOMAX) 0.4 MG CAPS capsule Take 1 capsule (0.4 mg total) by mouth daily after supper. 30 capsule 0    Allergies: No Known Allergies  Social History: Social History   Tobacco Use   Smoking status: Former    Current packs/day: 0.00    Average packs/day: 0.5 packs/day for 15.0 years (7.5 ttl pk-yrs)    Types: Cigarettes    Start date: 09/03/1968    Quit date: 09/04/1983    Years since quitting: 39.8   Smokeless tobacco: Former  Building services engineer  status: Never Used  Substance Use Topics   Alcohol use: Yes    Comment: very rare   Drug use: No    Family History Family History  Problem Relation Age of Onset   Stroke Mother    Dementia Mother    Colon cancer Neg Hx    Rectal cancer Neg Hx    Stomach cancer Neg Hx    Colon polyps Neg Hx    Esophageal cancer Neg Hx     Review of Systems 10 systems were reviewed and are negative except as noted specifically in the HPI.  Objective   Vital signs in last 24 hours: BP (!) 189/119   Pulse (!) 102   Temp 97.8 F (36.6 C)   Resp 16   Ht 5\' 7"  (1.702 m)   Wt 87.5 kg   SpO2 100%   BMI 30.23 kg/m   Physical Exam General: NAD, A&O, resting,  appropriate HEENT: [place]/AT, EOMI, MMM Pulmonary: Normal work of breathing Cardiovascular: HDS, adequate peripheral perfusion Abdomen: Soft, NTTP, nondistended,. GU: VS, Left CVA tenderness Extremities: warm and well perfused Neuro: Appropriate, no focal neurological deficits  Most Recent Labs: Lab Results  Component Value Date   WBC 7.0 11/12/2019   HGB 15.6 11/12/2019   HCT 45.5 11/12/2019   PLT 215 11/12/2019    Lab Results  Component Value Date   NA 141 11/12/2019   K 3.6 11/12/2019   CL 104 11/12/2019   CO2 29 11/12/2019   BUN 21 11/12/2019   CREATININE 0.86 11/12/2019   CALCIUM 9.1 11/12/2019    No results found for: "INR", "APTT"   Urine Culture: @[id number] (laburin,org,[id number])@   IMAGING: CT Renal Stone Study  Result Date: 07/02/2023 CLINICAL DATA:  Stone burden, lithotripsy yesterday, increased pain, nausea, and vomiting EXAM: CT ABDOMEN AND PELVIS WITHOUT CONTRAST TECHNIQUE: Multidetector CT imaging of the abdomen and pelvis was performed following the standard protocol without IV contrast. RADIATION DOSE REDUCTION: This exam was performed according to the departmental dose-optimization program which includes automated exposure control, adjustment of the mA and/or kV according to patient size and/or use of iterative reconstruction technique. COMPARISON:  05/11/2023 FINDINGS: Lower chest: No acute abnormality.  Small hiatal hernia. Hepatobiliary: No solid liver abnormality is seen. No gallstones, gallbladder wall thickening, or biliary dilatation. Pancreas: Unremarkable. No pancreatic ductal dilatation or surrounding inflammatory changes. Spleen: Normal in size without significant abnormality. Adrenals/Urinary Tract: Adrenal glands are unremarkable. Small adjacent calculi or fragments within the proximal left ureter measuring up to 0.4 cm (series 7, image 77, series 3, image 45). Mild associated left hydronephrosis, with multiple additional calculus fragments within  the left renal pelvis and nonobstructive calculi within the inferior pole of the left kidney (series 3, image 39, 44). Bladder is unremarkable. Stomach/Bowel: Stomach is within normal limits. Appendix appears normal. No evidence of bowel wall thickening, distention, or inflammatory changes. Vascular/Lymphatic: Aortic atherosclerosis. No enlarged abdominal or pelvic lymph nodes. Reproductive: No mass or other significant abnormality. Other: No abdominal wall hernia or abnormality. No ascites. Musculoskeletal: No acute or significant osseous findings. IMPRESSION: 1. Small adjacent calculi or fragments within the proximal left ureter measuring up to 0.4 cm. Mild associated left hydronephrosis, with multiple additional calculus fragments within the left renal pelvis and nonobstructive calculi within the inferior pole of the left kidney. 2. No right-sided calculi or hydronephrosis. Aortic Atherosclerosis (ICD10-I70.0). Electronically Signed   By: Jearld Lesch M.D.   On: 07/02/2023 13:56    ------  Assessment:  67 y.o. male  with past medical history significant for left UPJ stone now presenting to the ED 1 day postop with uncontrolled left flank pain.  Patient is hemodynamically stable, but in significant pain.  CT scan shows approximately 5 mm left proximal stone burden, likely Steinstrasse's from ESWL yesterday.  At this point, patient should undergo extensive multimodal pain therapy.  This can include Tylenol, Flomax, Toradol pending creatinine (BMP has not yet been drawn), and oxycodone.  Would also recommend aggressive fluid resuscitation.  If patient's pain still not controlled, can plan to admit for observation with interval left ureteral stenting on 07/03/2023   Recommendations: -Recommend multi pain therapy and emergency department with Tylenol, Toradol (if creatinine comes back reasonable), and Flomax. -Recommend bolus of IV normal saline x 1 L -If pain still uncontrolled, can consider hospital  admission with interval stenting tomorrow, 07/03/2023 -Urology will continue to evaluate today  Thank you for this consult. Please contact the urology consult pager with any further questions/concerns.

## 2023-07-02 NOTE — ED Provider Notes (Signed)
  Physical Exam  BP (!) 194/109   Pulse (!) 110   Temp 97.9 F (36.6 C) (Oral)   Resp 17   Ht 5\' 7"  (1.702 m)   Wt 87.5 kg   SpO2 98%   BMI 30.23 kg/m   Physical Exam  Procedures  Procedures  ED Course / MDM    Medical Decision Making Amount and/or Complexity of Data Reviewed Labs: ordered. Radiology: ordered.  Risk Prescription drug management.   Received in signout.  Flank pain with recent lithotripsy. continued tachycardia with feeling better.  Seen by urology.  They would stent in the morning the patient had to go home.  Tolerating orals and feeling better.  Seems reasonable.  Adjusting medicines per urology recommendations including p.o. Toradol.  Will discharge home.       Benjiman Core, MD 07/02/23 Windell Moment

## 2023-07-02 NOTE — ED Notes (Signed)
Tolerated 20 oz of water without difficulty

## 2023-07-02 NOTE — ED Provider Notes (Signed)
[place] EMERGENCY DEPARTMENT AT [facility] Provider Note   CSN: [id number] Arrival date & time: 07/02/23  1210     History {Add pertinent medical, surgical, social history, OB history to HPI:1} Chief Complaint  Patient presents with   Flank Pain    Richard Webster is a 67 y.o. male.  HPI    67 year old male comes in with chief complaint of flank pain. Patient is status post lithotripsy on the left side because of nephrolithiasis from yesterday.  Patient states that he was doing well until sometime later this morning when he started having sudden severe pain in his back radiating towards his groin.  The pain is excruciating.  He has associated nausea.  Patient denies any bloody urine.  Home Medications Prior to Admission medications   Medication Sig Start Date End Date Taking? Authorizing Provider  aspirin EC 81 MG tablet Take 1 tablet (81 mg total) by mouth daily. 07/03/23   Jerilee Field, MD  HYDROcodone-acetaminophen (NORCO/VICODIN) 5-325 MG tablet Take 1 tablet by mouth every 6 (six) hours as needed for moderate pain (pain score 4-6). 07/01/23 06/30/24  Jerilee Field, MD  naproxen sodium (ALEVE) 220 MG tablet Take 1 tablet (220 mg total) by mouth daily as needed (pain). 07/04/23   Jerilee Field, MD  nitroGLYCERIN (NITROSTAT) 0.4 MG SL tablet Place under the tongue. 12/11/19 12/10/20  [provider]  olmesartan (BENICAR) 20 MG tablet Take by mouth.    [provider]  pantoprazole (PROTONIX) 40 MG tablet Take 40 mg by mouth daily. 09/17/21   [provider]  rosuvastatin (CRESTOR) 20 MG tablet Take by mouth. 10/01/22   [provider]  tamsulosin (FLOMAX) 0.4 MG CAPS capsule Take 1 capsule (0.4 mg total) by mouth daily after supper. 07/01/23   Jerilee Field, MD      Allergies    Patient has no known allergies.    Review of Systems   Review of Systems  All other systems reviewed and are negative.   Physical  Exam Updated Vital Signs BP (!) 189/119   Pulse (!) 102   Temp 97.8 F (36.6 C)   Resp 16   Ht 5\' 7"  (1.702 m)   Wt 87.5 kg   SpO2 100%   BMI 30.23 kg/m  Physical Exam Vitals and nursing note reviewed.  Constitutional:      General: He is in acute distress.     Appearance: He is well-developed. He is not ill-appearing.  HENT:     Head: Atraumatic.  Cardiovascular:     Rate and Rhythm: Tachycardia present.  Pulmonary:     Effort: Pulmonary effort is normal.  Musculoskeletal:     Cervical back: Neck supple.  Skin:    General: Skin is warm.  Neurological:     Mental Status: He is alert and oriented to person, place, and time.     ED Results / Procedures / Treatments   Labs (all labs ordered are listed, but only abnormal results are displayed) Labs Reviewed  CBC WITH DIFFERENTIAL/PLATELET - Abnormal; Notable for the following components:      Result Value   WBC 11.2 (*)    Neutro Abs 10.3 (*)    Lymphs Abs 0.4 (*)    All other components within normal limits  BASIC METABOLIC PANEL  URINALYSIS, W/ REFLEX TO CULTURE (INFECTION SUSPECTED)    EKG None  Radiology CT Renal Stone Study  Result Date: 07/02/2023 CLINICAL DATA:  Stone burden, lithotripsy yesterday, increased pain,  nausea, and vomiting EXAM: CT ABDOMEN AND PELVIS WITHOUT CONTRAST TECHNIQUE: Multidetector CT imaging of the abdomen and pelvis was performed following the standard protocol without IV contrast. RADIATION DOSE REDUCTION: This exam was performed according to the departmental dose-optimization program which includes automated exposure control, adjustment of the mA and/or kV according to patient size and/or use of iterative reconstruction technique. COMPARISON:  05/11/2023 FINDINGS: Lower chest: No acute abnormality.  Small hiatal hernia. Hepatobiliary: No solid liver abnormality is seen. No gallstones, gallbladder wall thickening, or biliary dilatation. Pancreas: Unremarkable. No pancreatic ductal  dilatation or surrounding inflammatory changes. Spleen: Normal in size without significant abnormality. Adrenals/Urinary Tract: Adrenal glands are unremarkable. Small adjacent calculi or fragments within the proximal left ureter measuring up to 0.4 cm (series 7, image 77, series 3, image 45). Mild associated left hydronephrosis, with multiple additional calculus fragments within the left renal pelvis and nonobstructive calculi within the inferior pole of the left kidney (series 3, image 39, 44). Bladder is unremarkable. Stomach/Bowel: Stomach is within normal limits. Appendix appears normal. No evidence of bowel wall thickening, distention, or inflammatory changes. Vascular/Lymphatic: Aortic atherosclerosis. No enlarged abdominal or pelvic lymph nodes. Reproductive: No mass or other significant abnormality. Other: No abdominal wall hernia or abnormality. No ascites. Musculoskeletal: No acute or significant osseous findings. IMPRESSION: 1. Small adjacent calculi or fragments within the proximal left ureter measuring up to 0.4 cm. Mild associated left hydronephrosis, with multiple additional calculus fragments within the left renal pelvis and nonobstructive calculi within the inferior pole of the left kidney. 2. No right-sided calculi or hydronephrosis. Aortic Atherosclerosis (ICD10-I70.0). Electronically Signed   By: Jearld Lesch M.D.   On: 07/02/2023 13:56    Procedures Procedures  {Document cardiac monitor, telemetry assessment procedure when appropriate:1}  Medications Ordered in ED Medications  HYDROmorphone (DILAUDID) injection 1 mg (1 mg Intravenous Given 07/02/23 1426)  ondansetron (ZOFRAN) injection 4 mg (4 mg Intravenous Given 07/02/23 1425)    ED Course/ Medical Decision Making/ A&P   {   Click here for ABCD2, HEART and other calculatorsREFRESH Note before signing :1}                              Medical Decision Making Amount and/or Complexity of Data Reviewed Labs: ordered. Radiology:  ordered.  Risk Prescription drug management.   67 year old male comes in with chief complaint of left-sided flank pain radiating to his groin.  Patient is status post lithotripsy from yesterday.  Denies any UTI-like symptoms associate with this or any blood in the urine.  On exam patient is uncomfortable with left-sided tenderness.  Differential diagnosis primarily includes ureteral colic, pyelonephritis, perinephric hematoma, renal laceration.  Plan is to get CT renal stone studies and basic labs.  Reassessment: I have independently interpreted patient's CT scan.  It does appear that he has stones that are transitioning.  I did consult urology and spoke with Dr. Jennette Dubin.  He recommends basic labs and focusing on symptom management at this time.  Also wants to make sure patient does not have renal failure.  At this time Dilaudid has been ordered.  Lab results are pending.  If you are able to control his symptoms, he can be safely discharged.  Final Clinical Impression(s) / ED Diagnoses Final diagnoses:  Ureteral colic    Rx / DC Orders ED Discharge Orders     None

## 2023-07-02 NOTE — ED Triage Notes (Signed)
Patient reports having a lithotripsy for left sided kidney stones yesterday. Since has had increased pain, nausea, and vomiting. Took home pain meds this AM without relief. Unable to take his BP meds last night. Hypertensive on arrival.

## 2023-07-04 ENCOUNTER — Encounter (HOSPITAL_BASED_OUTPATIENT_CLINIC_OR_DEPARTMENT_OTHER): Payer: Self-pay | Admitting: Urology

## 2023-07-04 LAB — URINE CULTURE: Culture: NO GROWTH

## 2023-07-15 DIAGNOSIS — N201 Calculus of ureter: Secondary | ICD-10-CM | POA: Diagnosis not present

## 2023-07-15 DIAGNOSIS — N202 Calculus of kidney with calculus of ureter: Secondary | ICD-10-CM | POA: Diagnosis not present

## 2023-08-03 DIAGNOSIS — N200 Calculus of kidney: Secondary | ICD-10-CM | POA: Diagnosis not present

## 2023-08-03 DIAGNOSIS — M79675 Pain in left toe(s): Secondary | ICD-10-CM | POA: Diagnosis not present

## 2023-08-03 DIAGNOSIS — M79672 Pain in left foot: Secondary | ICD-10-CM | POA: Diagnosis not present

## 2023-08-03 DIAGNOSIS — M19072 Primary osteoarthritis, left ankle and foot: Secondary | ICD-10-CM | POA: Diagnosis not present

## 2023-08-03 DIAGNOSIS — M1A00X0 Idiopathic chronic gout, unspecified site, without tophus (tophi): Secondary | ICD-10-CM | POA: Diagnosis not present

## 2023-08-22 DIAGNOSIS — M1A00X0 Idiopathic chronic gout, unspecified site, without tophus (tophi): Secondary | ICD-10-CM | POA: Diagnosis not present

## 2023-08-22 DIAGNOSIS — M79672 Pain in left foot: Secondary | ICD-10-CM | POA: Diagnosis not present

## 2023-08-22 DIAGNOSIS — I25118 Atherosclerotic heart disease of native coronary artery with other forms of angina pectoris: Secondary | ICD-10-CM | POA: Diagnosis not present

## 2023-09-29 DIAGNOSIS — M461 Sacroiliitis, not elsewhere classified: Secondary | ICD-10-CM | POA: Diagnosis not present

## 2023-09-29 DIAGNOSIS — M9902 Segmental and somatic dysfunction of thoracic region: Secondary | ICD-10-CM | POA: Diagnosis not present

## 2023-09-29 DIAGNOSIS — M5414 Radiculopathy, thoracic region: Secondary | ICD-10-CM | POA: Diagnosis not present

## 2023-09-29 DIAGNOSIS — M9903 Segmental and somatic dysfunction of lumbar region: Secondary | ICD-10-CM | POA: Diagnosis not present

## 2023-09-29 DIAGNOSIS — M5441 Lumbago with sciatica, right side: Secondary | ICD-10-CM | POA: Diagnosis not present

## 2023-09-29 DIAGNOSIS — M5451 Vertebrogenic low back pain: Secondary | ICD-10-CM | POA: Diagnosis not present

## 2023-09-29 DIAGNOSIS — M9904 Segmental and somatic dysfunction of sacral region: Secondary | ICD-10-CM | POA: Diagnosis not present

## 2023-09-29 DIAGNOSIS — M5442 Lumbago with sciatica, left side: Secondary | ICD-10-CM | POA: Diagnosis not present

## 2023-09-29 IMAGING — CT CT RENAL STONE PROTOCOL
1 of 2 series · 15 of 32 positions shown, 19 images · non-contrast
Comparison: CT abdomen and pelvis 10/27/2020

CLINICAL DATA: Right flank pain



[Series 2: axial st · axial · 0.81mm/px · z∈[-1201,-751]mm · 15 of 100 slices shown, 19 images]
[im 5/100  soft-tissue]
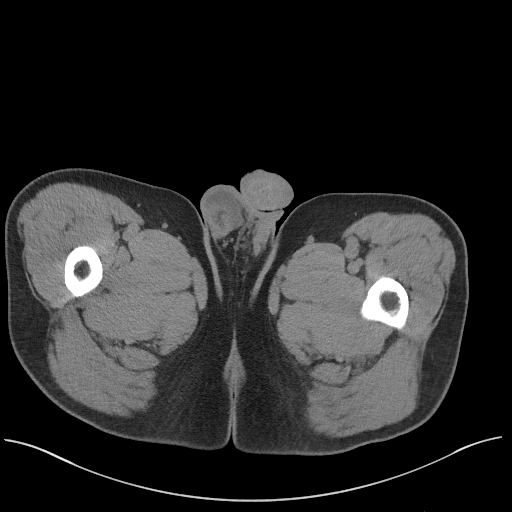
[im 5/100  bone]
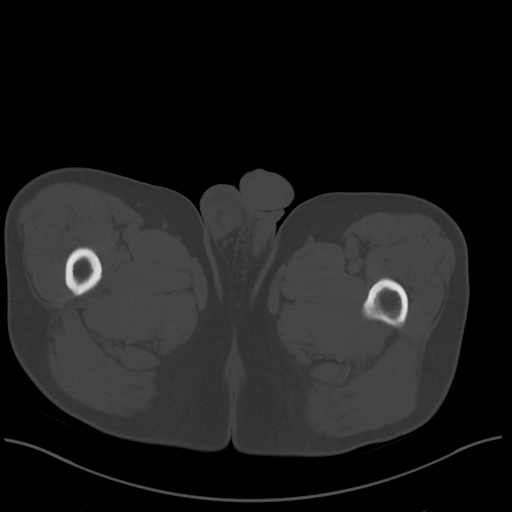
[im 13/100  soft-tissue]
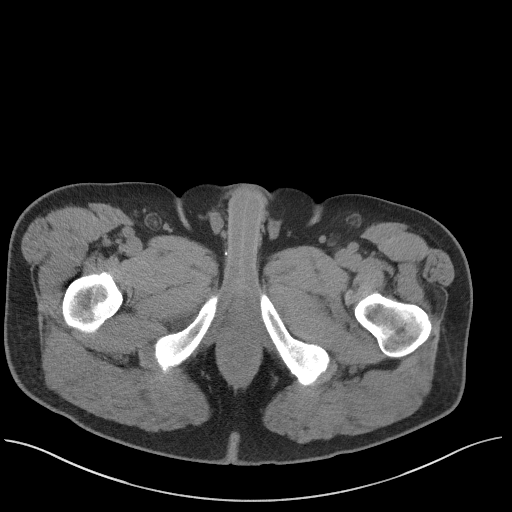
[im 22/100  soft-tissue]
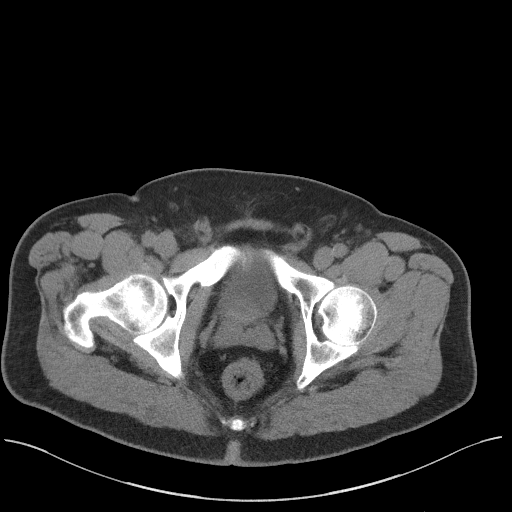
[im 26/100  soft-tissue]
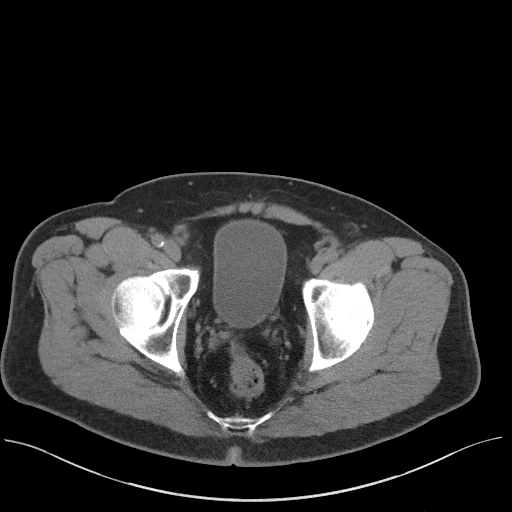
[im 35/100  soft-tissue]
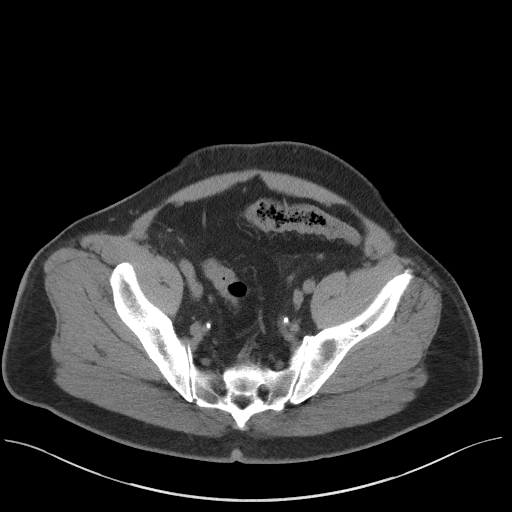
[im 44/100  soft-tissue]
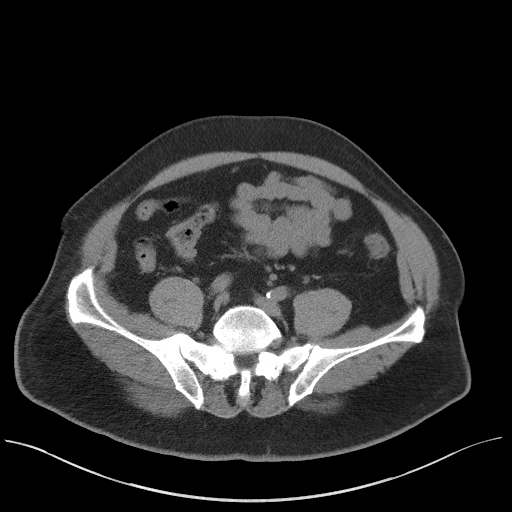
[im 52/100  soft-tissue]
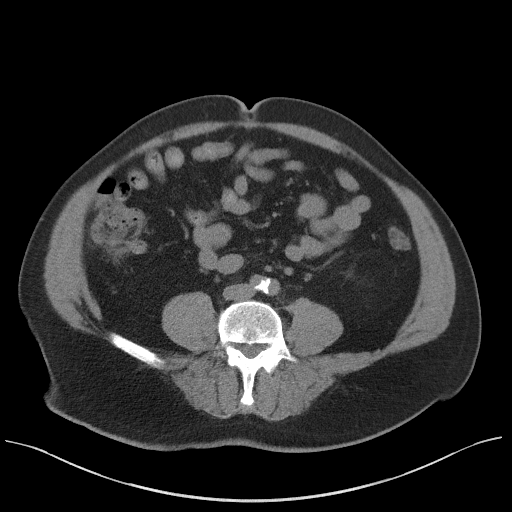
[im 56/100  soft-tissue]
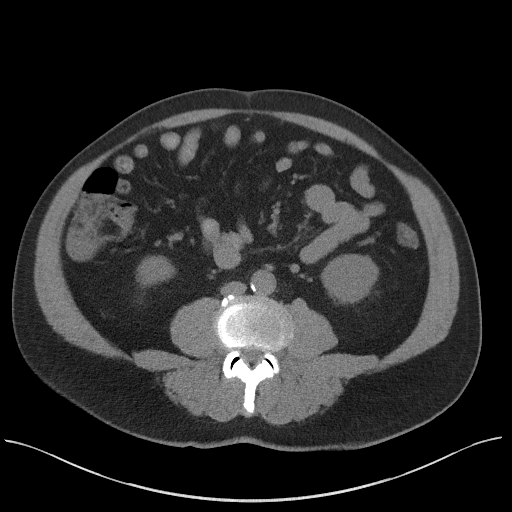
[im 65/100  soft-tissue]
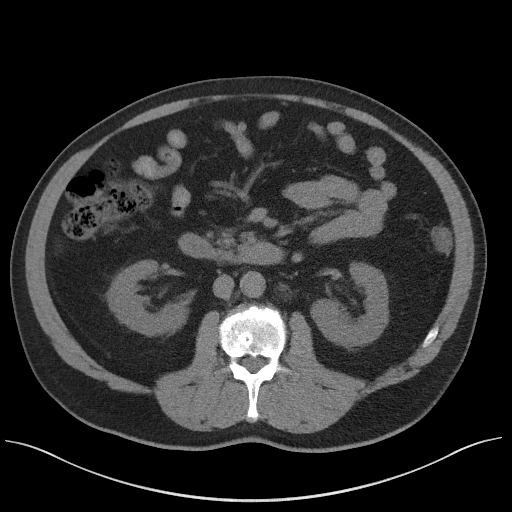
[im 65/100  bone]
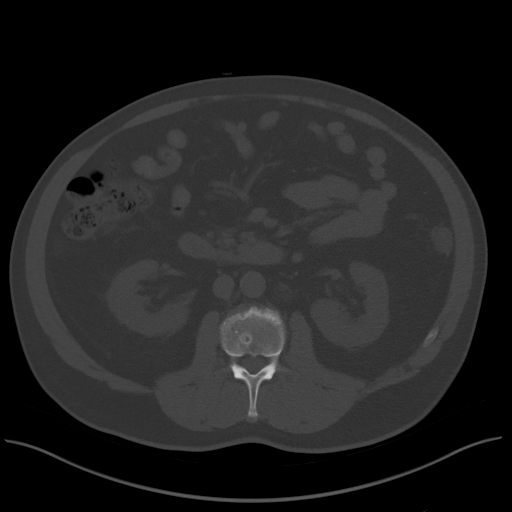
[im 74/100  soft-tissue]
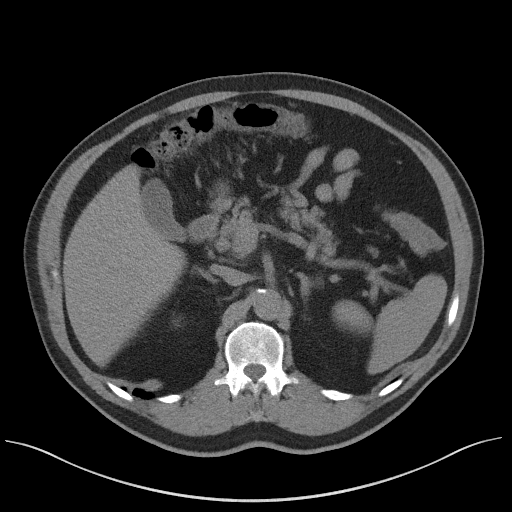
[im 78/100  soft-tissue]
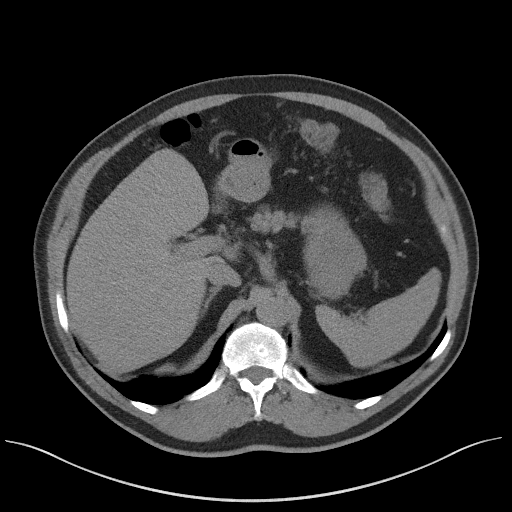
[im 82/100  lung]
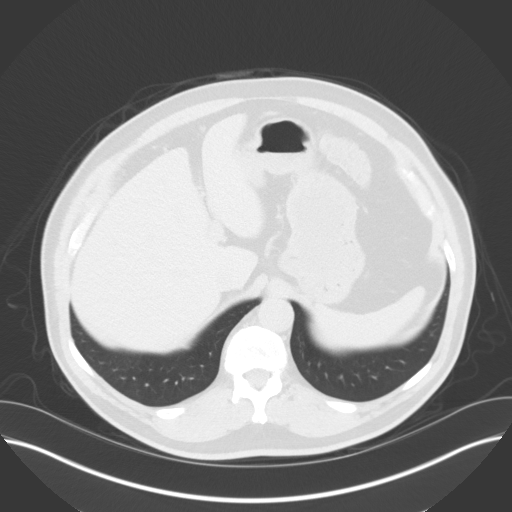
[im 87/100  soft-tissue]
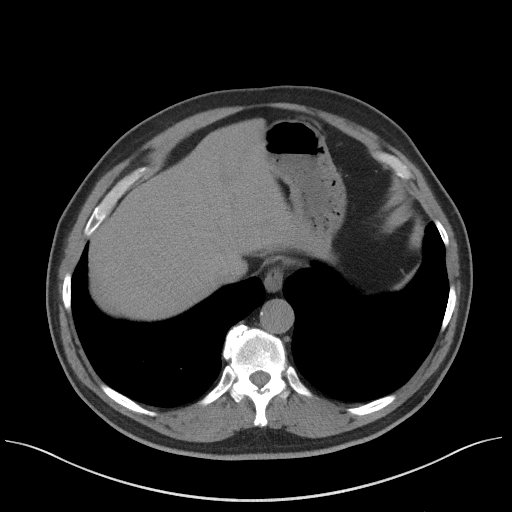
[im 87/100  lung]
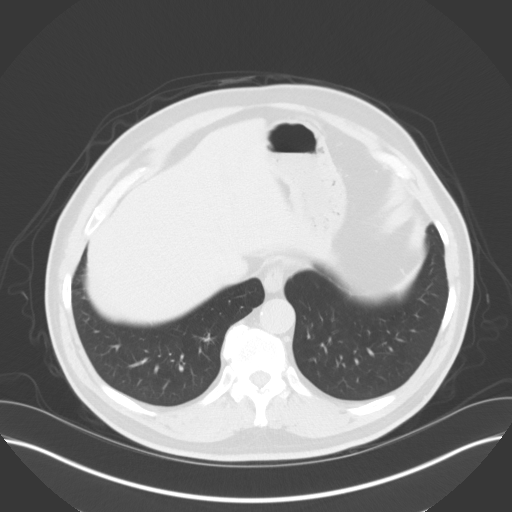
[im 91/100  lung]
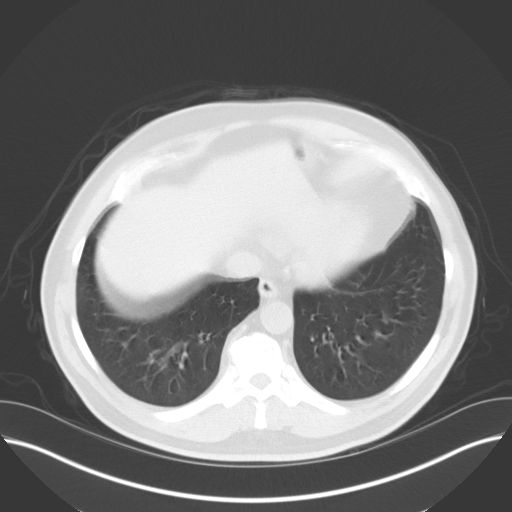
[im 95/100  soft-tissue]
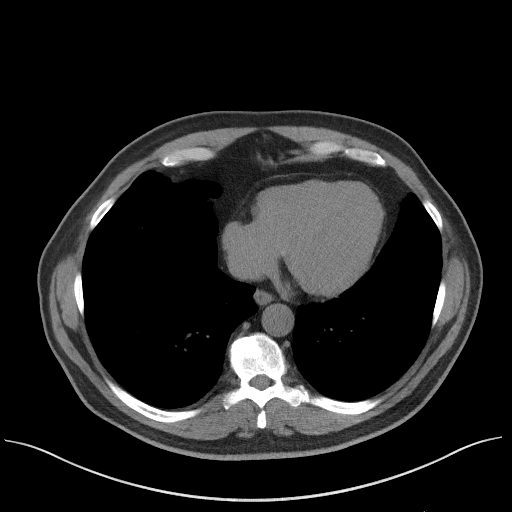
[im 95/100  lung]
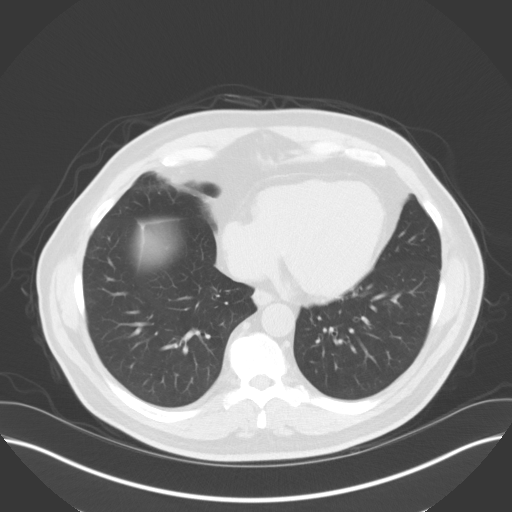

[15 of 32 positions shown; findings below may reference images not displayed]

FINDINGS: Lower chest: No acute abnormality.

Hepatobiliary: Liver is normal in size and contour with no
suspicious mass identified. Stable tiny hypodensity in the left
hepatic lobe. Gallbladder appears normal. No biliary ductal
dilatation identified.

Pancreas: Unremarkable. No pancreatic ductal dilatation or
surrounding inflammatory changes.

Spleen: Normal in size without focal abnormality.

Adrenals/Urinary Tract: Adrenal glands appear normal. Chronic 9 mm
calculus in the lower pole left kidney. No right-sided
nephrolithiasis. No hydronephrosis or hydroureter identified
bilaterally. Urinary bladder appears normal.

Stomach/Bowel: Stomach is within normal limits. Appendix appears
normal. No evidence of bowel wall thickening, distention, or
inflammatory changes.

Vascular/Lymphatic: Aortic atherosclerosis. No enlarged abdominal or
pelvic lymph nodes.

Reproductive: Prostate is unremarkable.

Other: No ascites.

Musculoskeletal: No acute or significant osseous findings.
IMPRESSION: 1. 9 mm left renal calculus.
2. No hydronephrosis.

## 2023-10-05 DIAGNOSIS — Z20822 Contact with and (suspected) exposure to covid-19: Secondary | ICD-10-CM | POA: Diagnosis not present

## 2023-10-05 DIAGNOSIS — J101 Influenza due to other identified influenza virus with other respiratory manifestations: Secondary | ICD-10-CM | POA: Diagnosis not present

## 2023-10-05 DIAGNOSIS — R509 Fever, unspecified: Secondary | ICD-10-CM | POA: Diagnosis not present

## 2023-10-12 DIAGNOSIS — E1151 Type 2 diabetes mellitus with diabetic peripheral angiopathy without gangrene: Secondary | ICD-10-CM | POA: Diagnosis not present

## 2023-10-12 DIAGNOSIS — I25118 Atherosclerotic heart disease of native coronary artery with other forms of angina pectoris: Secondary | ICD-10-CM | POA: Diagnosis not present

## 2023-10-12 DIAGNOSIS — Z125 Encounter for screening for malignant neoplasm of prostate: Secondary | ICD-10-CM | POA: Diagnosis not present

## 2023-10-19 DIAGNOSIS — M19072 Primary osteoarthritis, left ankle and foot: Secondary | ICD-10-CM | POA: Diagnosis not present

## 2023-10-19 DIAGNOSIS — E1151 Type 2 diabetes mellitus with diabetic peripheral angiopathy without gangrene: Secondary | ICD-10-CM | POA: Diagnosis not present

## 2023-10-19 DIAGNOSIS — Z0000 Encounter for general adult medical examination without abnormal findings: Secondary | ICD-10-CM | POA: Diagnosis not present

## 2023-10-20 DIAGNOSIS — M5441 Lumbago with sciatica, right side: Secondary | ICD-10-CM | POA: Diagnosis not present

## 2023-10-20 DIAGNOSIS — M5414 Radiculopathy, thoracic region: Secondary | ICD-10-CM | POA: Diagnosis not present

## 2023-10-20 DIAGNOSIS — M5442 Lumbago with sciatica, left side: Secondary | ICD-10-CM | POA: Diagnosis not present

## 2023-10-20 DIAGNOSIS — M5451 Vertebrogenic low back pain: Secondary | ICD-10-CM | POA: Diagnosis not present

## 2023-10-20 DIAGNOSIS — M461 Sacroiliitis, not elsewhere classified: Secondary | ICD-10-CM | POA: Diagnosis not present

## 2023-10-20 DIAGNOSIS — M9904 Segmental and somatic dysfunction of sacral region: Secondary | ICD-10-CM | POA: Diagnosis not present

## 2023-10-20 DIAGNOSIS — M9903 Segmental and somatic dysfunction of lumbar region: Secondary | ICD-10-CM | POA: Diagnosis not present

## 2023-10-20 DIAGNOSIS — M9902 Segmental and somatic dysfunction of thoracic region: Secondary | ICD-10-CM | POA: Diagnosis not present

## 2023-11-03 DIAGNOSIS — M9904 Segmental and somatic dysfunction of sacral region: Secondary | ICD-10-CM | POA: Diagnosis not present

## 2023-11-03 DIAGNOSIS — M5441 Lumbago with sciatica, right side: Secondary | ICD-10-CM | POA: Diagnosis not present

## 2023-11-03 DIAGNOSIS — M5442 Lumbago with sciatica, left side: Secondary | ICD-10-CM | POA: Diagnosis not present

## 2023-11-03 DIAGNOSIS — M5414 Radiculopathy, thoracic region: Secondary | ICD-10-CM | POA: Diagnosis not present

## 2023-11-03 DIAGNOSIS — M9903 Segmental and somatic dysfunction of lumbar region: Secondary | ICD-10-CM | POA: Diagnosis not present

## 2023-11-03 DIAGNOSIS — M5451 Vertebrogenic low back pain: Secondary | ICD-10-CM | POA: Diagnosis not present

## 2023-11-03 DIAGNOSIS — M9902 Segmental and somatic dysfunction of thoracic region: Secondary | ICD-10-CM | POA: Diagnosis not present

## 2023-11-03 DIAGNOSIS — M461 Sacroiliitis, not elsewhere classified: Secondary | ICD-10-CM | POA: Diagnosis not present

## 2023-11-22 DIAGNOSIS — M5451 Vertebrogenic low back pain: Secondary | ICD-10-CM | POA: Diagnosis not present

## 2023-11-22 DIAGNOSIS — M461 Sacroiliitis, not elsewhere classified: Secondary | ICD-10-CM | POA: Diagnosis not present

## 2023-11-22 DIAGNOSIS — M9902 Segmental and somatic dysfunction of thoracic region: Secondary | ICD-10-CM | POA: Diagnosis not present

## 2023-11-22 DIAGNOSIS — M5441 Lumbago with sciatica, right side: Secondary | ICD-10-CM | POA: Diagnosis not present

## 2023-11-22 DIAGNOSIS — M5414 Radiculopathy, thoracic region: Secondary | ICD-10-CM | POA: Diagnosis not present

## 2023-11-22 DIAGNOSIS — M9903 Segmental and somatic dysfunction of lumbar region: Secondary | ICD-10-CM | POA: Diagnosis not present

## 2023-11-22 DIAGNOSIS — M5442 Lumbago with sciatica, left side: Secondary | ICD-10-CM | POA: Diagnosis not present

## 2023-11-22 DIAGNOSIS — M9904 Segmental and somatic dysfunction of sacral region: Secondary | ICD-10-CM | POA: Diagnosis not present

## 2023-11-29 ENCOUNTER — Ambulatory Visit
Admission: RE | Admit: 2023-11-29 | Discharge: 2023-11-29 | Disposition: A | Discharge status: Home / Self Care | Source: Ambulatory Visit | Attending: Internal Medicine | Admitting: Internal Medicine

## 2023-11-29 ENCOUNTER — Other Ambulatory Visit: Payer: Self-pay | Admitting: Internal Medicine

## 2023-11-29 DIAGNOSIS — N319 Neuromuscular dysfunction of bladder, unspecified: Secondary | ICD-10-CM

## 2023-11-29 DIAGNOSIS — M51369 Other intervertebral disc degeneration, lumbar region without mention of lumbar back pain or lower extremity pain: Secondary | ICD-10-CM | POA: Diagnosis not present

## 2023-11-29 DIAGNOSIS — G959 Disease of spinal cord, unspecified: Secondary | ICD-10-CM | POA: Insufficient documentation

## 2023-11-29 DIAGNOSIS — M48061 Spinal stenosis, lumbar region without neurogenic claudication: Secondary | ICD-10-CM | POA: Diagnosis not present

## 2023-11-29 DIAGNOSIS — M51379 Other intervertebral disc degeneration, lumbosacral region without mention of lumbar back pain or lower extremity pain: Secondary | ICD-10-CM | POA: Diagnosis not present

## 2023-11-29 DIAGNOSIS — M7918 Myalgia, other site: Secondary | ICD-10-CM | POA: Diagnosis not present

## 2023-11-29 DIAGNOSIS — M47816 Spondylosis without myelopathy or radiculopathy, lumbar region: Secondary | ICD-10-CM | POA: Diagnosis not present

## 2023-11-29 MED ORDER — GADOBUTROL 1 MMOL/ML IV SOLN
10.0000 mL | Freq: Once | INTRAVENOUS | Status: AC | PRN
  Administered 2023-11-29: 9 mL via INTRAVENOUS

## 2023-12-05 DIAGNOSIS — M48062 Spinal stenosis, lumbar region with neurogenic claudication: Secondary | ICD-10-CM | POA: Diagnosis not present

## 2023-12-05 DIAGNOSIS — M5416 Radiculopathy, lumbar region: Secondary | ICD-10-CM | POA: Diagnosis not present

## 2023-12-06 DIAGNOSIS — G959 Disease of spinal cord, unspecified: Secondary | ICD-10-CM | POA: Diagnosis not present

## 2023-12-06 DIAGNOSIS — N319 Neuromuscular dysfunction of bladder, unspecified: Secondary | ICD-10-CM | POA: Diagnosis not present

## 2023-12-27 DIAGNOSIS — M48062 Spinal stenosis, lumbar region with neurogenic claudication: Secondary | ICD-10-CM | POA: Diagnosis not present

## 2023-12-27 DIAGNOSIS — M5416 Radiculopathy, lumbar region: Secondary | ICD-10-CM | POA: Diagnosis not present

## 2024-01-30 DIAGNOSIS — M48062 Spinal stenosis, lumbar region with neurogenic claudication: Secondary | ICD-10-CM | POA: Diagnosis not present

## 2024-01-30 DIAGNOSIS — M5416 Radiculopathy, lumbar region: Secondary | ICD-10-CM | POA: Diagnosis not present

## 2024-02-21 DIAGNOSIS — E1151 Type 2 diabetes mellitus with diabetic peripheral angiopathy without gangrene: Secondary | ICD-10-CM | POA: Diagnosis not present

## 2024-02-21 DIAGNOSIS — I700 Atherosclerosis of aorta: Secondary | ICD-10-CM | POA: Diagnosis not present

## 2024-02-21 DIAGNOSIS — Z6830 Body mass index (BMI) 30.0-30.9, adult: Secondary | ICD-10-CM | POA: Diagnosis not present

## 2024-02-21 DIAGNOSIS — E782 Mixed hyperlipidemia: Secondary | ICD-10-CM | POA: Diagnosis not present

## 2024-02-21 DIAGNOSIS — I25118 Atherosclerotic heart disease of native coronary artery with other forms of angina pectoris: Secondary | ICD-10-CM | POA: Diagnosis not present

## 2024-02-21 DIAGNOSIS — I4510 Unspecified right bundle-branch block: Secondary | ICD-10-CM | POA: Diagnosis not present

## 2024-02-21 DIAGNOSIS — E66811 Obesity, class 1: Secondary | ICD-10-CM | POA: Diagnosis not present

## 2024-02-21 DIAGNOSIS — I272 Pulmonary hypertension, unspecified: Secondary | ICD-10-CM | POA: Diagnosis not present

## 2024-02-21 DIAGNOSIS — I10 Essential (primary) hypertension: Secondary | ICD-10-CM | POA: Diagnosis not present

## 2024-02-21 DIAGNOSIS — Z955 Presence of coronary angioplasty implant and graft: Secondary | ICD-10-CM | POA: Diagnosis not present

## 2024-04-02 DIAGNOSIS — M48062 Spinal stenosis, lumbar region with neurogenic claudication: Secondary | ICD-10-CM | POA: Diagnosis not present

## 2024-04-02 DIAGNOSIS — M5416 Radiculopathy, lumbar region: Secondary | ICD-10-CM | POA: Diagnosis not present

## 2024-04-12 DIAGNOSIS — E1151 Type 2 diabetes mellitus with diabetic peripheral angiopathy without gangrene: Secondary | ICD-10-CM | POA: Diagnosis not present

## 2024-04-19 DIAGNOSIS — Z79899 Other long term (current) drug therapy: Secondary | ICD-10-CM | POA: Diagnosis not present

## 2024-04-19 DIAGNOSIS — M5416 Radiculopathy, lumbar region: Secondary | ICD-10-CM | POA: Diagnosis not present

## 2024-04-19 DIAGNOSIS — Z125 Encounter for screening for malignant neoplasm of prostate: Secondary | ICD-10-CM | POA: Diagnosis not present

## 2024-04-19 DIAGNOSIS — E1151 Type 2 diabetes mellitus with diabetic peripheral angiopathy without gangrene: Secondary | ICD-10-CM | POA: Diagnosis not present

## 2024-04-25 DIAGNOSIS — M5442 Lumbago with sciatica, left side: Secondary | ICD-10-CM | POA: Diagnosis not present

## 2024-04-25 DIAGNOSIS — G8929 Other chronic pain: Secondary | ICD-10-CM | POA: Diagnosis not present

## 2024-04-25 DIAGNOSIS — M5441 Lumbago with sciatica, right side: Secondary | ICD-10-CM | POA: Diagnosis not present
